# Patient Record
Sex: Female | Born: 1940
Health system: Southern US, Community
[De-identification: ages and names within clinical notes are randomized; demographics above are authoritative.]

## PROBLEM LIST (undated history)

## (undated) DIAGNOSIS — M79605 Pain in left leg: Secondary | ICD-10-CM

## (undated) DIAGNOSIS — H18519 Endothelial corneal dystrophy, unspecified eye: Secondary | ICD-10-CM

## (undated) DIAGNOSIS — E785 Hyperlipidemia, unspecified: Secondary | ICD-10-CM

## (undated) DIAGNOSIS — R42 Dizziness and giddiness: Secondary | ICD-10-CM

## (undated) DIAGNOSIS — H1851 Endothelial corneal dystrophy: Secondary | ICD-10-CM

## (undated) DIAGNOSIS — M79604 Pain in right leg: Secondary | ICD-10-CM

## (undated) DIAGNOSIS — M722 Plantar fascial fibromatosis: Secondary | ICD-10-CM

## (undated) DIAGNOSIS — E669 Obesity, unspecified: Secondary | ICD-10-CM

## (undated) DIAGNOSIS — E119 Type 2 diabetes mellitus without complications: Secondary | ICD-10-CM

## (undated) DIAGNOSIS — IMO0002 Reserved for concepts with insufficient information to code with codable children: Secondary | ICD-10-CM

## (undated) DIAGNOSIS — I10 Essential (primary) hypertension: Secondary | ICD-10-CM

## (undated) DIAGNOSIS — M1990 Unspecified osteoarthritis, unspecified site: Secondary | ICD-10-CM

## (undated) HISTORY — PX: CATARACT EXTRACTION: SUR2

## (undated) HISTORY — DX: Type 2 diabetes mellitus without complications: E11.9

## (undated) HISTORY — DX: Hyperlipidemia, unspecified: E78.5

## (undated) HISTORY — DX: Plantar fascial fibromatosis: M72.2

## (undated) HISTORY — DX: Endothelial corneal dystrophy: H18.51

## (undated) HISTORY — PX: TOTAL KNEE ARTHROPLASTY: SHX125

## (undated) HISTORY — DX: Endothelial corneal dystrophy, unspecified eye: H18.519

## (undated) HISTORY — DX: Pain in left leg: M79.605

## (undated) HISTORY — DX: Reserved for concepts with insufficient information to code with codable children: IMO0002

## (undated) HISTORY — DX: Essential (primary) hypertension: I10

## (undated) HISTORY — DX: Unspecified osteoarthritis, unspecified site: M19.90

## (undated) HISTORY — DX: Dizziness and giddiness: R42

## (undated) HISTORY — PX: TOTAL VAGINAL HYSTERECTOMY: SHX2548

## (undated) HISTORY — DX: Obesity, unspecified: E66.9

## (undated) HISTORY — DX: Pain in right leg: M79.604

---

## 1999-07-11 ENCOUNTER — Encounter: Admission: RE | Admit: 1999-07-11 | Discharge: 1999-07-11 | Payer: Self-pay | Admitting: Hematology and Oncology

## 2000-03-01 ENCOUNTER — Encounter: Admission: RE | Admit: 2000-03-01 | Discharge: 2000-03-01 | Payer: Self-pay | Admitting: Internal Medicine

## 2000-04-01 ENCOUNTER — Encounter: Admission: RE | Admit: 2000-04-01 | Discharge: 2000-04-01 | Payer: Self-pay | Admitting: Hematology and Oncology

## 2000-04-02 ENCOUNTER — Ambulatory Visit (HOSPITAL_COMMUNITY): Admission: RE | Admit: 2000-04-02 | Discharge: 2000-04-02 | Payer: Self-pay | Admitting: Internal Medicine

## 2000-04-06 ENCOUNTER — Encounter: Admission: RE | Admit: 2000-04-06 | Discharge: 2000-07-05 | Payer: Self-pay | Admitting: Internal Medicine

## 2000-04-12 ENCOUNTER — Ambulatory Visit (HOSPITAL_COMMUNITY): Admission: RE | Admit: 2000-04-12 | Discharge: 2000-04-12 | Payer: Self-pay | Admitting: Hematology and Oncology

## 2000-04-13 ENCOUNTER — Ambulatory Visit (HOSPITAL_COMMUNITY): Admission: RE | Admit: 2000-04-13 | Discharge: 2000-04-13 | Payer: Self-pay | Admitting: Internal Medicine

## 2000-04-29 ENCOUNTER — Encounter: Admission: RE | Admit: 2000-04-29 | Discharge: 2000-04-29 | Payer: Self-pay | Admitting: Internal Medicine

## 2001-05-11 ENCOUNTER — Encounter: Admission: RE | Admit: 2001-05-11 | Discharge: 2001-05-11 | Payer: Self-pay | Admitting: Internal Medicine

## 2002-04-03 ENCOUNTER — Encounter: Admission: RE | Admit: 2002-04-03 | Discharge: 2002-04-03 | Payer: Self-pay | Admitting: Internal Medicine

## 2002-04-10 ENCOUNTER — Ambulatory Visit (HOSPITAL_COMMUNITY): Admission: RE | Admit: 2002-04-10 | Discharge: 2002-04-10 | Payer: Self-pay | Admitting: Internal Medicine

## 2002-04-17 ENCOUNTER — Encounter: Admission: RE | Admit: 2002-04-17 | Discharge: 2002-04-17 | Payer: Self-pay | Admitting: Internal Medicine

## 2002-05-15 ENCOUNTER — Encounter: Admission: RE | Admit: 2002-05-15 | Discharge: 2002-05-15 | Payer: Self-pay | Admitting: Internal Medicine

## 2002-05-18 ENCOUNTER — Ambulatory Visit (HOSPITAL_COMMUNITY): Admission: RE | Admit: 2002-05-18 | Discharge: 2002-05-18 | Payer: Self-pay | Admitting: Internal Medicine

## 2002-06-19 ENCOUNTER — Encounter: Admission: RE | Admit: 2002-06-19 | Discharge: 2002-06-19 | Payer: Self-pay | Admitting: Internal Medicine

## 2002-08-11 ENCOUNTER — Ambulatory Visit (HOSPITAL_COMMUNITY): Admission: RE | Admit: 2002-08-11 | Discharge: 2002-08-11 | Payer: Self-pay | Admitting: Gastroenterology

## 2002-11-22 ENCOUNTER — Encounter: Admission: RE | Admit: 2002-11-22 | Discharge: 2002-11-22 | Payer: Self-pay | Admitting: Internal Medicine

## 2002-11-29 ENCOUNTER — Encounter: Admission: RE | Admit: 2002-11-29 | Discharge: 2002-11-29 | Payer: Self-pay | Admitting: Internal Medicine

## 2003-01-08 ENCOUNTER — Encounter: Admission: RE | Admit: 2003-01-08 | Discharge: 2003-01-08 | Payer: Self-pay | Admitting: Internal Medicine

## 2003-02-26 ENCOUNTER — Encounter: Admission: RE | Admit: 2003-02-26 | Discharge: 2003-02-26 | Payer: Self-pay | Admitting: Internal Medicine

## 2003-04-04 ENCOUNTER — Encounter: Admission: RE | Admit: 2003-04-04 | Discharge: 2003-04-04 | Payer: Self-pay | Admitting: Internal Medicine

## 2003-04-11 ENCOUNTER — Encounter: Admission: RE | Admit: 2003-04-11 | Discharge: 2003-05-09 | Payer: Self-pay | Admitting: Infectious Diseases

## 2003-06-27 ENCOUNTER — Encounter: Admission: RE | Admit: 2003-06-27 | Discharge: 2003-06-27 | Payer: Self-pay | Admitting: Internal Medicine

## 2003-08-02 ENCOUNTER — Encounter: Admission: RE | Admit: 2003-08-02 | Discharge: 2003-08-02 | Payer: Self-pay | Admitting: Internal Medicine

## 2003-08-23 ENCOUNTER — Encounter: Admission: RE | Admit: 2003-08-23 | Discharge: 2003-08-23 | Payer: Self-pay | Admitting: Internal Medicine

## 2003-08-23 ENCOUNTER — Ambulatory Visit (HOSPITAL_COMMUNITY): Admission: RE | Admit: 2003-08-23 | Discharge: 2003-08-23 | Payer: Self-pay | Admitting: Internal Medicine

## 2003-09-10 ENCOUNTER — Encounter: Admission: RE | Admit: 2003-09-10 | Discharge: 2003-09-10 | Payer: Self-pay | Admitting: Internal Medicine

## 2003-09-21 ENCOUNTER — Ambulatory Visit (HOSPITAL_COMMUNITY): Admission: RE | Admit: 2003-09-21 | Discharge: 2003-09-21 | Payer: Self-pay | Admitting: Internal Medicine

## 2003-09-21 ENCOUNTER — Encounter: Admission: RE | Admit: 2003-09-21 | Discharge: 2003-09-21 | Payer: Self-pay | Admitting: Internal Medicine

## 2003-10-03 ENCOUNTER — Encounter: Admission: RE | Admit: 2003-10-03 | Discharge: 2003-10-03 | Payer: Self-pay | Admitting: Internal Medicine

## 2003-10-23 ENCOUNTER — Encounter: Admission: RE | Admit: 2003-10-23 | Discharge: 2003-10-23 | Payer: Self-pay | Admitting: Internal Medicine

## 2003-10-26 ENCOUNTER — Ambulatory Visit: Payer: Self-pay | Admitting: Internal Medicine

## 2003-11-29 ENCOUNTER — Ambulatory Visit: Payer: Self-pay | Admitting: Internal Medicine

## 2003-12-07 ENCOUNTER — Encounter: Admission: RE | Admit: 2003-12-07 | Discharge: 2004-03-06 | Payer: Self-pay | Admitting: Infectious Diseases

## 2003-12-14 ENCOUNTER — Encounter: Admission: RE | Admit: 2003-12-14 | Discharge: 2003-12-14 | Payer: Self-pay | Admitting: Internal Medicine

## 2003-12-26 ENCOUNTER — Encounter: Admission: RE | Admit: 2003-12-26 | Discharge: 2004-03-25 | Payer: Self-pay | Admitting: Internal Medicine

## 2004-01-01 ENCOUNTER — Ambulatory Visit: Payer: Self-pay | Admitting: Internal Medicine

## 2004-06-05 ENCOUNTER — Ambulatory Visit: Payer: Self-pay | Admitting: Internal Medicine

## 2004-09-30 ENCOUNTER — Ambulatory Visit: Payer: Self-pay | Admitting: Internal Medicine

## 2004-09-30 ENCOUNTER — Ambulatory Visit (HOSPITAL_COMMUNITY): Admission: RE | Admit: 2004-09-30 | Discharge: 2004-09-30 | Payer: Self-pay | Admitting: Internal Medicine

## 2004-10-08 ENCOUNTER — Ambulatory Visit (HOSPITAL_COMMUNITY): Admission: RE | Admit: 2004-10-08 | Discharge: 2004-10-08 | Payer: Self-pay | Admitting: Internal Medicine

## 2004-11-04 ENCOUNTER — Ambulatory Visit (HOSPITAL_COMMUNITY): Admission: RE | Admit: 2004-11-04 | Discharge: 2004-11-04 | Payer: Self-pay | Admitting: Internal Medicine

## 2004-11-04 ENCOUNTER — Ambulatory Visit: Payer: Self-pay | Admitting: Internal Medicine

## 2004-11-11 ENCOUNTER — Ambulatory Visit: Payer: Self-pay | Admitting: Internal Medicine

## 2004-11-11 ENCOUNTER — Ambulatory Visit (HOSPITAL_COMMUNITY): Admission: RE | Admit: 2004-11-11 | Discharge: 2004-11-11 | Payer: Self-pay | Admitting: Internal Medicine

## 2004-11-21 ENCOUNTER — Ambulatory Visit: Payer: Self-pay | Admitting: Internal Medicine

## 2004-12-02 ENCOUNTER — Ambulatory Visit: Payer: Self-pay | Admitting: Hospitalist

## 2004-12-21 ENCOUNTER — Emergency Department (HOSPITAL_COMMUNITY): Admission: EM | Admit: 2004-12-21 | Discharge: 2004-12-21 | Payer: Self-pay | Admitting: Emergency Medicine

## 2005-03-27 ENCOUNTER — Encounter (INDEPENDENT_AMBULATORY_CARE_PROVIDER_SITE_OTHER): Payer: Self-pay | Admitting: Internal Medicine

## 2005-03-27 LAB — CONVERTED CEMR LAB: Microalbumin U total vol: 0.36 mg/L

## 2005-04-02 ENCOUNTER — Ambulatory Visit: Payer: Self-pay | Admitting: Internal Medicine

## 2005-04-21 ENCOUNTER — Ambulatory Visit: Payer: Self-pay | Admitting: Internal Medicine

## 2005-05-12 ENCOUNTER — Ambulatory Visit: Payer: Self-pay | Admitting: Hospitalist

## 2005-11-18 ENCOUNTER — Ambulatory Visit: Payer: Self-pay | Admitting: Hospitalist

## 2005-11-19 ENCOUNTER — Encounter (INDEPENDENT_AMBULATORY_CARE_PROVIDER_SITE_OTHER): Payer: Self-pay | Admitting: Internal Medicine

## 2005-11-19 LAB — CONVERTED CEMR LAB: TSH: 2.965 microintl units/mL

## 2005-12-03 ENCOUNTER — Ambulatory Visit (HOSPITAL_COMMUNITY): Admission: RE | Admit: 2005-12-03 | Discharge: 2005-12-03 | Payer: Self-pay | Admitting: Family Medicine

## 2005-12-10 ENCOUNTER — Encounter (INDEPENDENT_AMBULATORY_CARE_PROVIDER_SITE_OTHER): Payer: Self-pay | Admitting: Internal Medicine

## 2005-12-10 DIAGNOSIS — J329 Chronic sinusitis, unspecified: Secondary | ICD-10-CM | POA: Insufficient documentation

## 2005-12-10 DIAGNOSIS — E669 Obesity, unspecified: Secondary | ICD-10-CM | POA: Insufficient documentation

## 2005-12-10 DIAGNOSIS — G4700 Insomnia, unspecified: Secondary | ICD-10-CM | POA: Insufficient documentation

## 2005-12-10 DIAGNOSIS — Z8719 Personal history of other diseases of the digestive system: Secondary | ICD-10-CM | POA: Insufficient documentation

## 2005-12-10 DIAGNOSIS — I10 Essential (primary) hypertension: Secondary | ICD-10-CM | POA: Insufficient documentation

## 2005-12-10 DIAGNOSIS — H268 Other specified cataract: Secondary | ICD-10-CM | POA: Insufficient documentation

## 2005-12-10 DIAGNOSIS — M1990 Unspecified osteoarthritis, unspecified site: Secondary | ICD-10-CM | POA: Insufficient documentation

## 2005-12-10 DIAGNOSIS — H9319 Tinnitus, unspecified ear: Secondary | ICD-10-CM | POA: Insufficient documentation

## 2005-12-10 DIAGNOSIS — L659 Nonscarring hair loss, unspecified: Secondary | ICD-10-CM | POA: Insufficient documentation

## 2005-12-10 DIAGNOSIS — G43909 Migraine, unspecified, not intractable, without status migrainosus: Secondary | ICD-10-CM | POA: Insufficient documentation

## 2005-12-24 ENCOUNTER — Ambulatory Visit: Payer: Self-pay | Admitting: Internal Medicine

## 2006-02-10 ENCOUNTER — Ambulatory Visit: Payer: Self-pay | Admitting: *Deleted

## 2007-10-18 ENCOUNTER — Encounter: Admission: RE | Admit: 2007-10-18 | Discharge: 2007-10-18 | Payer: Self-pay | Admitting: Family Medicine

## 2007-10-19 ENCOUNTER — Ambulatory Visit (HOSPITAL_COMMUNITY): Admission: RE | Admit: 2007-10-19 | Discharge: 2007-10-19 | Payer: Self-pay | Admitting: Family Medicine

## 2007-11-02 ENCOUNTER — Inpatient Hospital Stay (HOSPITAL_COMMUNITY): Admission: RE | Admit: 2007-11-02 | Discharge: 2007-11-07 | Payer: Self-pay | Admitting: Orthopedic Surgery

## 2008-01-16 ENCOUNTER — Ambulatory Visit (HOSPITAL_COMMUNITY): Admission: RE | Admit: 2008-01-16 | Discharge: 2008-01-16 | Payer: Self-pay | Admitting: Orthopedic Surgery

## 2008-04-15 HISTORY — PX: TOTAL KNEE ARTHROPLASTY: SHX125

## 2008-10-22 ENCOUNTER — Ambulatory Visit (HOSPITAL_COMMUNITY): Admission: RE | Admit: 2008-10-22 | Discharge: 2008-10-22 | Payer: Self-pay | Admitting: Family Medicine

## 2009-04-05 ENCOUNTER — Encounter: Admission: RE | Admit: 2009-04-05 | Discharge: 2009-04-05 | Payer: Self-pay | Admitting: Family Medicine

## 2009-04-15 ENCOUNTER — Inpatient Hospital Stay (HOSPITAL_COMMUNITY): Admission: RE | Admit: 2009-04-15 | Discharge: 2009-04-18 | Payer: Self-pay | Admitting: Orthopedic Surgery

## 2010-01-03 ENCOUNTER — Ambulatory Visit (HOSPITAL_COMMUNITY): Admission: RE | Admit: 2010-01-03 | Discharge: 2010-01-03 | Payer: Self-pay | Admitting: Family Medicine

## 2010-05-14 LAB — GLUCOSE, CAPILLARY
Glucose-Capillary: 107 mg/dL — ABNORMAL HIGH (ref 70–99)
Glucose-Capillary: 111 mg/dL — ABNORMAL HIGH (ref 70–99)
Glucose-Capillary: 115 mg/dL — ABNORMAL HIGH (ref 70–99)
Glucose-Capillary: 123 mg/dL — ABNORMAL HIGH (ref 70–99)
Glucose-Capillary: 126 mg/dL — ABNORMAL HIGH (ref 70–99)
Glucose-Capillary: 128 mg/dL — ABNORMAL HIGH (ref 70–99)
Glucose-Capillary: 155 mg/dL — ABNORMAL HIGH (ref 70–99)
Glucose-Capillary: 168 mg/dL — ABNORMAL HIGH (ref 70–99)
Glucose-Capillary: 93 mg/dL (ref 70–99)
Glucose-Capillary: 99 mg/dL (ref 70–99)

## 2010-05-14 LAB — PROTIME-INR
INR: 1.73 — ABNORMAL HIGH (ref 0.00–1.49)
Prothrombin Time: 15.4 seconds — ABNORMAL HIGH (ref 11.6–15.2)

## 2010-05-14 LAB — URINALYSIS, ROUTINE W REFLEX MICROSCOPIC
Glucose, UA: NEGATIVE mg/dL
Ketones, ur: NEGATIVE mg/dL
Nitrite: NEGATIVE
Protein, ur: NEGATIVE mg/dL

## 2010-05-14 LAB — BASIC METABOLIC PANEL
CO2: 25 mEq/L (ref 19–32)
Chloride: 106 mEq/L (ref 96–112)
Creatinine, Ser: 0.99 mg/dL (ref 0.4–1.2)
GFR calc non Af Amer: 49 mL/min — ABNORMAL LOW (ref >60)
GFR calc non Af Amer: 55 mL/min — ABNORMAL LOW (ref >60)
Potassium: 4 mEq/L (ref 3.5–5.1)
Potassium: 4.3 mEq/L (ref 3.5–5.1)
Sodium: 135 mEq/L (ref 135–145)

## 2010-05-14 LAB — CBC
HCT: 29.6 % — ABNORMAL LOW (ref 36.0–46.0)
Hemoglobin: 10.2 g/dL — ABNORMAL LOW (ref 12.0–15.0)
Hemoglobin: 10.6 g/dL — ABNORMAL LOW (ref 12.0–15.0)
MCHC: 34.1 g/dL (ref 30.0–36.0)
MCHC: 35 g/dL (ref 30.0–36.0)
Platelets: 122 10*3/uL — ABNORMAL LOW (ref 150–400)
Platelets: 146 10*3/uL — ABNORMAL LOW (ref 150–400)
RBC: 3.23 MIL/uL — ABNORMAL LOW (ref 3.87–5.11)
RDW: 13.8 % (ref 11.5–15.5)
RDW: 13.9 % (ref 11.5–15.5)
WBC: 10.3 10*3/uL (ref 4.0–10.5)
WBC: 10.4 10*3/uL (ref 4.0–10.5)

## 2010-06-05 ENCOUNTER — Other Ambulatory Visit: Payer: Self-pay | Admitting: Family Medicine

## 2010-06-05 ENCOUNTER — Ambulatory Visit
Admission: RE | Admit: 2010-06-05 | Discharge: 2010-06-05 | Disposition: A | Discharge status: Home / Self Care | Payer: Medicare Other | Source: Ambulatory Visit | Attending: Family Medicine | Admitting: Family Medicine

## 2010-06-05 DIAGNOSIS — M545 Low back pain, unspecified: Secondary | ICD-10-CM

## 2010-07-08 NOTE — Op Note (Signed)
[name], [name]               ACCOUNT NO.:  [id number]   MEDICAL RECORD NO.:  [id number]          PATIENT TYPE:  AMB   LOCATION:  DAY                          FACILITY:  [facility]   PHYSICIAN:  Ollen Gross, M.D.    DATE OF BIRTH:  1940-10-04   DATE OF PROCEDURE:  01/16/2008  DATE OF DISCHARGE:                               OPERATIVE REPORT   PREOPERATIVE DIAGNOSIS:  Right knee arthrofibrosis.   POSTOPERATIVE DIAGNOSIS:  Right knee arthrofibrosis.   PROCEDURE:  Right knee closed manipulation.   SURGEON:  Ollen Gross, M.D.   ASSISTANT:  No assistant.   ANESTHESIA:  General.   Premanipulation, range of motion 5 to 95 post manipulation range of  motion 1 to 125.   COMPLICATIONS:  None.   CONDITION:  Stable to recovery.   CLINICAL NOTE:  [name] is a 70 year old female who underwent right  total knee arthroplasty approximately 2 months ago.  She has not  progressed in physical therapy for the past 4 weeks.  She is stuck at 95  degrees of flexion.  She has pain due to stiffness.  She presents now  for closed manipulation.   PROCEDURE IN DETAIL:  And the successful administration of general  anesthetic, exam under anesthesia performed.  She has range of motion 5-  95 degrees.  I subsequently placed my chest on the proximal tibia and  flexed the knee with audible lysis of adhesions.  I was easily able to  get her to 125 degrees.  I was also to achieve full extension and  improvement of patellar mobility.  She is subsequently awakened and  transferred to recovery in stable condition.      Ollen Gross, M.D.  Electronically Signed     FA/MEDQ  D:  01/16/2008  T:  01/17/2008  Job:  [id number]

## 2010-07-08 NOTE — H&P (Signed)
[name], [name]               ACCOUNT NO.:  [id number]   MEDICAL RECORD NO.:  [id number]          PATIENT TYPE:  INP   LOCATION:  1606                         FACILITY:  [facility]   PHYSICIAN:  [name], M.D.    DATE OF BIRTH:  December 10, 1940   DATE OF ADMISSION:  11/02/2007  DATE OF DISCHARGE:                              HISTORY & PHYSICAL   DATE OF OFFICE VISIT/HISTORY AND PHYSICAL:  September 27, 2007.   CHIEF COMPLAINT:  Right knee pain.   HISTORY OF PRESENT ILLNESS:  The patient is a 70 year old female who has  been seen by Dr. Lequita Halt for ongoing bilateral knee pain.  The pain in  her knees has been problematic for quite some time now.  She has been  seen by Dr. August Saucer in the past.  She has undergone conservative treatment,  including viscosupplementation.  She follows up with Dr. Lequita Halt and was  found to have significant bone-on-bone in the medial compartment of both  knees,  especially advanced in the right knee.  The right is actually  more symptomatic than the left.  She is at a point where she would like  to have something done about it.  Risks and benefits discussed.  The  patient subsequently was admitted to the hospital.   ALLERGIES:  No known drug allergies.   CURRENT MEDICATIONS:  Lipitor, amlodipine, Flexeril, Tylenol, Tekturna,  HCTZ, baby aspirin, omega-3 fish oil and fluticasone.   PAST MEDICAL HISTORY:  Migraines, tinnitus, vertigo, anxiety, cataracts,  bronchitis, hypertension, cardiac murmur, hypercholesterolemia, varicose  veins, reflux disease, hemorrhoids, diverticulosis, history of urinary  tract infections, diet-controlled diabetes, fibromyalgia, arthritis,  postmenopausal.   PAST SURGICAL HISTORY:  Hysterectomy and eye surgery.   SOCIAL HISTORY:  Divorced, retired.  Nonsmoker.  Rare intake of alcohol.  Three children.  Lives alone.  She would like to look into a skilled  rehab facility.   FAMILY HISTORY:  Mother with history of stroke and diabetes.   She has a  sibling with diabetes.   REVIEW OF SYSTEMS:  GENERAL:  Occasional chills.  No fevers, night  sweats.  NEUROLOGIC:  Occasional headaches and dizziness and insomnia  and also tinnitus.  RESPIRATORY:  Denies any shortness of breath,  productive cough or hemoptysis.  CARDIOVASCULAR:  Occasional  palpitations.  No chest pain or angina.  She does have a cardiac murmur.  GASTROINTESTINAL:  Occasional nausea and constipation.  No diarrhea, no  vomiting.  No bloody mucus in the stool.  GENITOURINARY:  A little bit  of frequency, nocturia.  No dysuria, hematuria.  MUSCULOSKELETAL:  Joint  pain.   PHYSICAL EXAMINATION:  VITAL SIGNS:  Pulse 64, respirations 14, blood  pressure 128/84.  GENERAL:  A 70 year old [place] female.  Well-nourished, well-  developed, overweight.  Accompanied by her daughter.  HEENT:  Normocephalic, atraumatic.  Pupils are round and reactive.  Does  wear glasses.  EOMs intact.  NECK:  Supple.  CHEST:  Clear.  HEART:  Regular rate and rhythm with a grade 3/6 systolic ejection  murmur best heard over the aortic point.  ABDOMEN:  Soft, round.  Bowel sounds present.  RECTAL/BREAST/GENITALIA:  Not done, not pertinent to present illness.  EXTREMITIES:  Right knee range of motion 10 to 125, marked crepitus,  tender over medial and lateral.  No instability.  Left knee shows range  of motion 5 to 120, marked crepitus, tender more medial than lateral.   IMPRESSION:  Osteoarthritis, right knee.   PLAN:  The patient admitted to [facility] to undergo a right  total knee replacement arthroplasty.  Surgery will be performed by Dr.  Ollen Gross.  She does want to look into a skilled nursing facility  such as [facility] or other rehab facilities.      Alexzandrew L. Perkins, P.A.C.      [name], M.D.  Electronically Signed    ALP/MEDQ  D:  11/01/2007  T:  11/02/2007  Job:  [id number]   cc:   [name], M.D.  Fax: [phone]    Emeterio Reeve, MD  Fax: [phone]

## 2010-07-08 NOTE — Discharge Summary (Signed)
[name], [name]               ACCOUNT NO.:  [id number]   MEDICAL RECORD NO.:  [id number]          PATIENT TYPE:  INP   LOCATION:  1606                         FACILITY:  [facility]   PHYSICIAN:  Ollen Gross, M.D.    DATE OF BIRTH:  June 02, 1940   DATE OF ADMISSION:  11/02/2007  DATE OF DISCHARGE:  11/07/2007                               DISCHARGE SUMMARY   TENTATIVE DATE OF DISCHARGE:  November 07, 2007.   ADMITTING DIAGNOSES:  1. Osteoarthritis right knee.  2. Migraines.  3. Tinnitus.  4. Vertigo.  5. Anxiety.  6. Cataracts.  7. History of bronchitis.  8. Hypertension.  9. Cardiac murmur.  10.Hypercholesterolemia.  11.Varicose veins.  12.Reflux disease.  13.Hemorrhoids.  14.History of diverticulosis.  15.History of urinary tract infection.  16.Diet controlled diabetes.  17.Fibromyalgia.  18.Arthritis.  19.Postmenopausal.   DISCHARGE DIAGNOSIS:  1. Osteoarthritis right knee status post right total knee      arthroplasty.  2. Migraines.  3. Tinnitus.  4. Vertigo.  5. Anxiety.  6. Cataracts.  7. History of bronchitis.  8. Hypertension.  9. Cardiac murmur.  10.Hypercholesterolemia.  11.Varicose veins.  12.Reflux disease.  13.Hemorrhoids.  14.History of diverticulosis.  15.History of urinary tract infection.  16.Diet controlled diabetes.  17.Fibromyalgia.  18.Arthritis.  19.Postmenopausal.   PROCEDURE:  November 02, 2007 right total knee.  Surgeon Dr. Lequita Halt.  Assistant Elizabeth Peace, PA-C.   ANESTHESIA:  General.   CONSULTS:  None.   BRIEF HISTORY:  [name] is a 70 year old female with end-stage  arthritis of right knee, progressive worsening pain dysfunction, failed  nonoperative management now presents for total knee arthroplasty.   LABORATORY DATA:  CBC on admission, hemoglobin 12.8, hematocrit 37.9,  red cell count 4.63, white cell count 9.6, platelets 225,000.  Chem  panel on admission all within normal limits with the exception of  minimally elevated creatinine 1.23.  PT/INR preop 13.6, 1.0 with PTT of  34.  Preop UA trace leukocyte esterase, only 0-2 white cells, rare  bacteria.  Serial CBCs were followed.  Hemoglobin dropped to 11.2 then  10.3.  Last H&H is 10.2 and 31.1.  Serial pro time  followed.  PT/INR  23.1 and 1.9.  Serial BMET followed.  Glucose went up to 160 back down  to 127.  Remaining electrolytes remained within normal limits.   CHEST X-RAY:  October 18, 2007, borderline heart size, no acute chest  findings.   EKG:  Dated October 14, 2007, normal sinus rhythm, no acute ischemic  changes.   HOSPITAL COURSE:  The patient was admitted to [facility].  Tolerated the procedure well, later transferred to the recovery room,  orthopedic floor started on PCA and p.o. analgesic for pain control  following surgery.  She did pretty well the morning of day one.  Hemoglobin was 11.2.  Output was excellent.  Started back on her home  meds for hypertension.  Started getting up and walking short distances  on day one.  By day two dressing was changed, incision looked good.  No  complaints.  She was afebrile.  Hemoglobin was 10.3.  She was slowly  progressing with physical therapy and the patient wanted to look into  skilled facility for rehab.  Discharge planning social worker got  involved with seeking of bed.  She stayed through the weekend, which was  day 3 and day 4, walking short distances.  Incision was healing well, no  complaints.  By day 5, November 07, 2007, it was thought that a bed  would possibly be available at [facility] today.  The patient was in  agreement and wanted to go.  She was progressing well, no issues, and  was discharged at that time.   DISCHARGE/PLAN:  1. Tentative date of discharge/transfer. November 07, 2007.  2. Discharge diagnoses, please see above.  3. Discharge meds Coumadin protocol.  Please titrate Coumadin level      for target INR between 2.0 and 3.0.  She needs  to be on Coumadin      for 3 weeks from date of surgery of November 02, 2007.  Colace 100      mg p.o. b.i.d., Tekturna 300 mg p.o. daily, hydrochlorothiazide      12.5 mg daily, Norvasc 10 mg daily, Lipitor 10 mg daily, laxative      of choice, enema of choice, Percocet 5 mg 1 or 2 every 4 hours as      needed for pain, Tylenol 325 one or two every 4 to 6 hours for mild      pain temporal headache.  Robaxin 500 mg p.o. q.6 to 8 hours p.r.n.      spasm.  Flonase 2 sprays per nostril daily p.r.n.  Dulcolax      suppositories per rectum p.r.n.   DIET:  Heart-healthy diet.   ACTIVITIES:  Weightbearing as tolerated to the right lower extremity.  Gait training, ambulation, ADLs, PT and OT for total knee protocol.  Daily dressing changes.  She may start showering.  However, do not  submerge incision under water.   FOLLOW UP:  She is to follow up with Dr. Lequita Halt in the office 2 weeks  from the date of surgery, please contact the office at [phone] to help  the patient arrange for an appointment followup and transportation.   DISPOSITION:  Tentative bed at [facility].   CONDITION ON DISCHARGE:  Improving.      [name], P.A.C.      Ollen Gross, M.D.  Electronically Signed    ALP/MEDQ  D:  11/07/2007  T:  11/07/2007  Job:  [id number]   cc:   Ollen Gross, M.D.  Fax: [phone]   Emeterio Reeve, MD  Fax: [phone]

## 2010-07-08 NOTE — Op Note (Signed)
NAMEMARIEME, Gross               ACCOUNT NO.:  [id number]   MEDICAL RECORD NO.:  [id number]          PATIENT TYPE:  INP   LOCATION:  1606                         FACILITY:  [facility]   PHYSICIAN:  [name], M.D.    DATE OF BIRTH:  05-05-1940   DATE OF PROCEDURE:  11/02/2007  DATE OF DISCHARGE:                               OPERATIVE REPORT   PREOPERATIVE DIAGNOSIS:  Osteoarthritis right knee.   POSTOPERATIVE DIAGNOSIS:  Osteoarthritis right knee.   PROCEDURE:  Right total knee arthroplasty.   SURGEON:  [name], M.D.   ASSISTANT:  [name], P.A.C.   ANESTHESIA:  General with postop Marcaine pain pump.   ESTIMATED BLOOD LOSS:  Minimal.   DRAINS:  Autovac x1.   COMPLICATIONS:  None.   CONDITION:  Stable to recovery.   BRIEF CLINICAL NOTE:  [name] is a 70 year old female who has end-  stage arthritis of the right knee with progressively worsening pain and  dysfunction.  She has failed nonoperative management and presents now  for total knee arthroplasty.   PROCEDURE IN DETAIL:  After the successful administration of general  anesthetic a tourniquet was placed high on the right thigh and right  lower extremity prepped and draped in the usual sterile fashion.  Extremity was wrapped in Esmarch, knee flexed, tourniquet inflated to  350 mmHg.  A midline incision was made with a 10 blade through  subcutaneous tissue to the level of the extensor mechanism.  A fresh  blade was used to make a medial parapatellar arthrotomy.  Soft tissue of  the proximal medial tibia subperiosteally elevated at the joint line  with the knife and semimembranosus bursa with a Cobb elevator.  Soft  tissue laterally was elevated with attention being paid to avoid the  patellar tendon and tibial tubercle.  The patella was subluxed laterally  and the knee flexed 90 degrees.  ACL and PCL removed.  Drill was used to  create a starting hole in the distal femur and the canal was  thoroughly  irrigated.  The 5 degree right valgus alignment guide was placed and  referencing off posterior condyles rotation was marked and the block  pinned to remove 11 mm off the distal femur.  I took 11 because of preop  flexion contracture.  Distal femoral resection was made with an  oscillating saw.  Sizing block was placed, size 3 is most appropriate.  Rotation was marked at the epicondylar axis.  Size 3 cutting block is  placed and the anterior posterior chamfer cuts were made.   Tibia subluxed forward and menisci removed.  Extramedullary tibial  alignment guide was placed referencing proximally at the medial aspect  of the tibial tubercle and distally along the second metatarsal axis and  tibial crest.  The block is pinned to remove about 4 mm off the more  deficient medial side.  Tibial resection is made with an oscillating  saw.  Size 2.5 is the most appropriate tibial component and the proximal  tibia was prepared with the modular drill and keel punch for a size 2.5.  Femoral  preparation is completed with the intercondylar cut for the size  3.   Size 2.5 mobile bearing tibial trial, size 3 posterior stabilized  femoral trial and a 10 mm posterior stabilized rotating platform insert  trial are placed.  With the 10 there is a little bit of varus-valgus.  Placement with the 12.5 allowed for full extension and showed excellent  varus-valgus and anterior-posterior balance throughout full range of  motion.  The patella was everted and this was measured to be 21 mm.  Freehand resection was taken to 12 mm, 35 template is placed, lug holes  were drilled, trial patella was placed and it tracks normally.  Osteophytes removed off the posterior femur with the trial placed.  All  trials were removed and the cut bone surfaces prepared with pulsatile  lavage.  Cement was mixed and once ready for implantation a size 2.5  mobile bearing tibial tray, size 3 posterior stabilized femur and a  35  patella are cemented into place and the patella was held with a clamp.  Trial 12.5 mm insert was placed, knee held in full extension, all  extruded cement removed.  Once the cement is fully hardened then the  permanent 12.5 mm posterior stabilized rotating platform insert is  placed in the tibial tray.  Wound was copiously irrigated with saline  solution and the extensor mechanism closed over an Autovac drain with  interrupted #1 PDS.  Flexion against gravity to 135 degrees.  The  tourniquet was released with a total time of 49 minutes.  The drain was  hooked to suction.  Subcu was closed with interrupted 2-0 Vicryl and  subcuticular running 4-0 Monocryl.  Catheter for the Marcaine pain pump  was placed and the pump was initiated.  Steri-Strips and bulky sterile  dressing were applied and she was placed into a knee immobilizer,  awakened and transported to recovery in stable condition.      [name], M.D.  Electronically Signed     FA/MEDQ  D:  11/02/2007  T:  11/02/2007  Job:  [id number]

## 2010-07-11 NOTE — Op Note (Signed)
   NAME:  [name], [name]                         ACCOUNT NO.:  [id number]   MEDICAL RECORD NO.:  [id number]                   PATIENT TYPE:  AMB   LOCATION:  ENDO                                 [facility]AC[facility]  [facility]   PHYSICIAN:  Graylin Shiver, M.D.                DATE OF BIRTH:  July 28, 1940   DATE OF PROCEDURE:  DATE OF DISCHARGE:                                 OPERATIVE REPORT   PROCEDURE:  Esophagogastroduodenoscopy.   INDICATIONS FOR PROCEDURE:  Intermittent dysphagia, intermittent heartburn.   Informed consent was obtained.   PREMEDICATION:  Fentanyl 50 mcg IV, Versed 4 mg IV.   DESCRIPTION OF PROCEDURE:  With the patient in the left lateral decubitus  position, the Olympus gastroscope was inserted into the oropharynx and  passed into the esophagus, it was advanced down the esophagus and into the  stomach and into the duodenum. The second portion and bulb of the duodenum  were normal. The stomach looked normal in its entirety. The scope was  retroflexed, the fundus and cardia looked normal. The scope was straightened  and brought back into the esophagus which looked normal in its entirety. The  esophagogastric junction was 30 cm. There was no evidence of esophageal  stricture or stenosis. She tolerated the procedure well without  complications.   IMPRESSION:  Normal esophagogastroduodenoscopy.                                               Graylin Shiver, M.D.    SFG/MEDQ  D:  08/11/2002  T:  08/12/2002  Job:  [id number]   cc:   Talmage Coin, M.D.  [address]  [place]  [place] [address]  Fax: [phone]

## 2010-07-11 NOTE — Op Note (Signed)
   NAME:  [name], [name] NO.:  [id number]   MEDICAL RECORD NO.:  [id number]                   PATIENT TYPE:   LOCATION:                                       FACILITY:   PHYSICIAN:  Graylin Shiver, M.D.                DATE OF BIRTH:   DATE OF PROCEDURE:  08/11/2002  DATE OF DISCHARGE:                                 OPERATIVE REPORT   PROCEDURE:  Colonoscopy.   INDICATIONS FOR PROCEDURE:  Rectal bleeding.   PREMEDICATION:  The procedure was done directly after an EGD.  Total  medications received fentanyl  75 mcg IV, Versed 8 mg IV.   DESCRIPTION OF PROCEDURE:  After informed consent was obtained, sedation was  achieved, rectal exam was performed. No masses were felt.  The Olympus  colonoscope was inserted into the rectum and advanced around the colon to  the cecum.  Cecal landmarks were identified.  The cecum and ascending colon  were normal.  The transverse colon was normal.  The descending colon and  sigmoid revealed occasional diverticula __________.  She tolerated the  procedure well without complications.   IMPRESSION:  Mild diverticulosis of the sigmoid colon.  Otherwise normal  colonoscopy.   COMMENTS:  I suspect that the patient's intermittent rectal bleeding is  secondary to some anal irritation.  There is no evidence of bleeding or  abnormality.                                               Graylin Shiver, M.D.    SFG/MEDQ  D:  08/11/2002  T:  08/12/2002  Job:  [id number]   cc:   Talmage Coin, M.D.  [address]  [place]  [place] [address]  Fax: [phone]

## 2010-11-25 LAB — BASIC METABOLIC PANEL
CO2: 26
Calcium: 9.4
Chloride: 105
Glucose, Bld: 108 — ABNORMAL HIGH
Sodium: 140

## 2010-11-26 LAB — BASIC METABOLIC PANEL
CO2: 25
Chloride: 107
GFR calc Af Amer: 55 — ABNORMAL LOW
GFR calc Af Amer: >60
GFR calc non Af Amer: 59 — ABNORMAL LOW
Glucose, Bld: 160 — ABNORMAL HIGH
Potassium: 4.1
Potassium: 4.4
Sodium: 138
Sodium: 138

## 2010-11-26 LAB — GLUCOSE, CAPILLARY: Glucose-Capillary: 117 — ABNORMAL HIGH

## 2010-11-26 LAB — CBC
HCT: 30.8 — ABNORMAL LOW
HCT: 31.1 — ABNORMAL LOW
HCT: 33.6 — ABNORMAL LOW
Hemoglobin: 10.2 — ABNORMAL LOW
Hemoglobin: 10.3 — ABNORMAL LOW
Hemoglobin: 11.2 — ABNORMAL LOW
MCHC: 32.7
RBC: 3.59 — ABNORMAL LOW
RBC: 3.9
RDW: 13.9
WBC: 13.2 — ABNORMAL HIGH
WBC: 13.5 — ABNORMAL HIGH

## 2010-11-26 LAB — PROTIME-INR
INR: 1.1
INR: 1.5
Prothrombin Time: 19.1 — ABNORMAL HIGH

## 2011-01-08 ENCOUNTER — Other Ambulatory Visit (HOSPITAL_COMMUNITY): Payer: Self-pay | Admitting: Family Medicine

## 2011-01-08 DIAGNOSIS — Z1231 Encounter for screening mammogram for malignant neoplasm of breast: Secondary | ICD-10-CM

## 2011-01-08 DIAGNOSIS — Z780 Asymptomatic menopausal state: Secondary | ICD-10-CM

## 2011-02-11 ENCOUNTER — Ambulatory Visit (HOSPITAL_COMMUNITY)
Admission: RE | Admit: 2011-02-11 | Discharge: 2011-02-11 | Disposition: A | Discharge status: Home / Self Care | Payer: Medicare Other | Source: Ambulatory Visit | Attending: Family Medicine | Admitting: Family Medicine

## 2011-02-11 DIAGNOSIS — Z13820 Encounter for screening for osteoporosis: Secondary | ICD-10-CM | POA: Insufficient documentation

## 2011-02-11 DIAGNOSIS — Z1231 Encounter for screening mammogram for malignant neoplasm of breast: Secondary | ICD-10-CM | POA: Insufficient documentation

## 2011-02-11 DIAGNOSIS — Z780 Asymptomatic menopausal state: Secondary | ICD-10-CM

## 2011-03-04 DIAGNOSIS — H269 Unspecified cataract: Secondary | ICD-10-CM | POA: Diagnosis not present

## 2011-04-24 DIAGNOSIS — H269 Unspecified cataract: Secondary | ICD-10-CM | POA: Diagnosis not present

## 2011-04-30 DIAGNOSIS — I10 Essential (primary) hypertension: Secondary | ICD-10-CM | POA: Diagnosis not present

## 2011-04-30 DIAGNOSIS — E119 Type 2 diabetes mellitus without complications: Secondary | ICD-10-CM | POA: Diagnosis not present

## 2011-04-30 DIAGNOSIS — H269 Unspecified cataract: Secondary | ICD-10-CM | POA: Diagnosis not present

## 2011-04-30 DIAGNOSIS — H2589 Other age-related cataract: Secondary | ICD-10-CM | POA: Diagnosis not present

## 2011-04-30 DIAGNOSIS — Z8719 Personal history of other diseases of the digestive system: Secondary | ICD-10-CM | POA: Insufficient documentation

## 2011-05-07 DIAGNOSIS — M5137 Other intervertebral disc degeneration, lumbosacral region: Secondary | ICD-10-CM | POA: Diagnosis not present

## 2011-05-07 DIAGNOSIS — IMO0002 Reserved for concepts with insufficient information to code with codable children: Secondary | ICD-10-CM | POA: Diagnosis not present

## 2011-05-07 DIAGNOSIS — J309 Allergic rhinitis, unspecified: Secondary | ICD-10-CM | POA: Diagnosis not present

## 2011-05-07 DIAGNOSIS — J329 Chronic sinusitis, unspecified: Secondary | ICD-10-CM | POA: Diagnosis not present

## 2011-05-21 IMAGING — CR DG CHEST 2V
2 series · 2 of 2 positions shown · non-contrast
Comparison: 10/18/2007

CLINICAL DATA: Preop evaluation for cardiovascular exam, cardiac
murmur

CHEST - 2 VIEW

[view not recorded (1 of 2)]
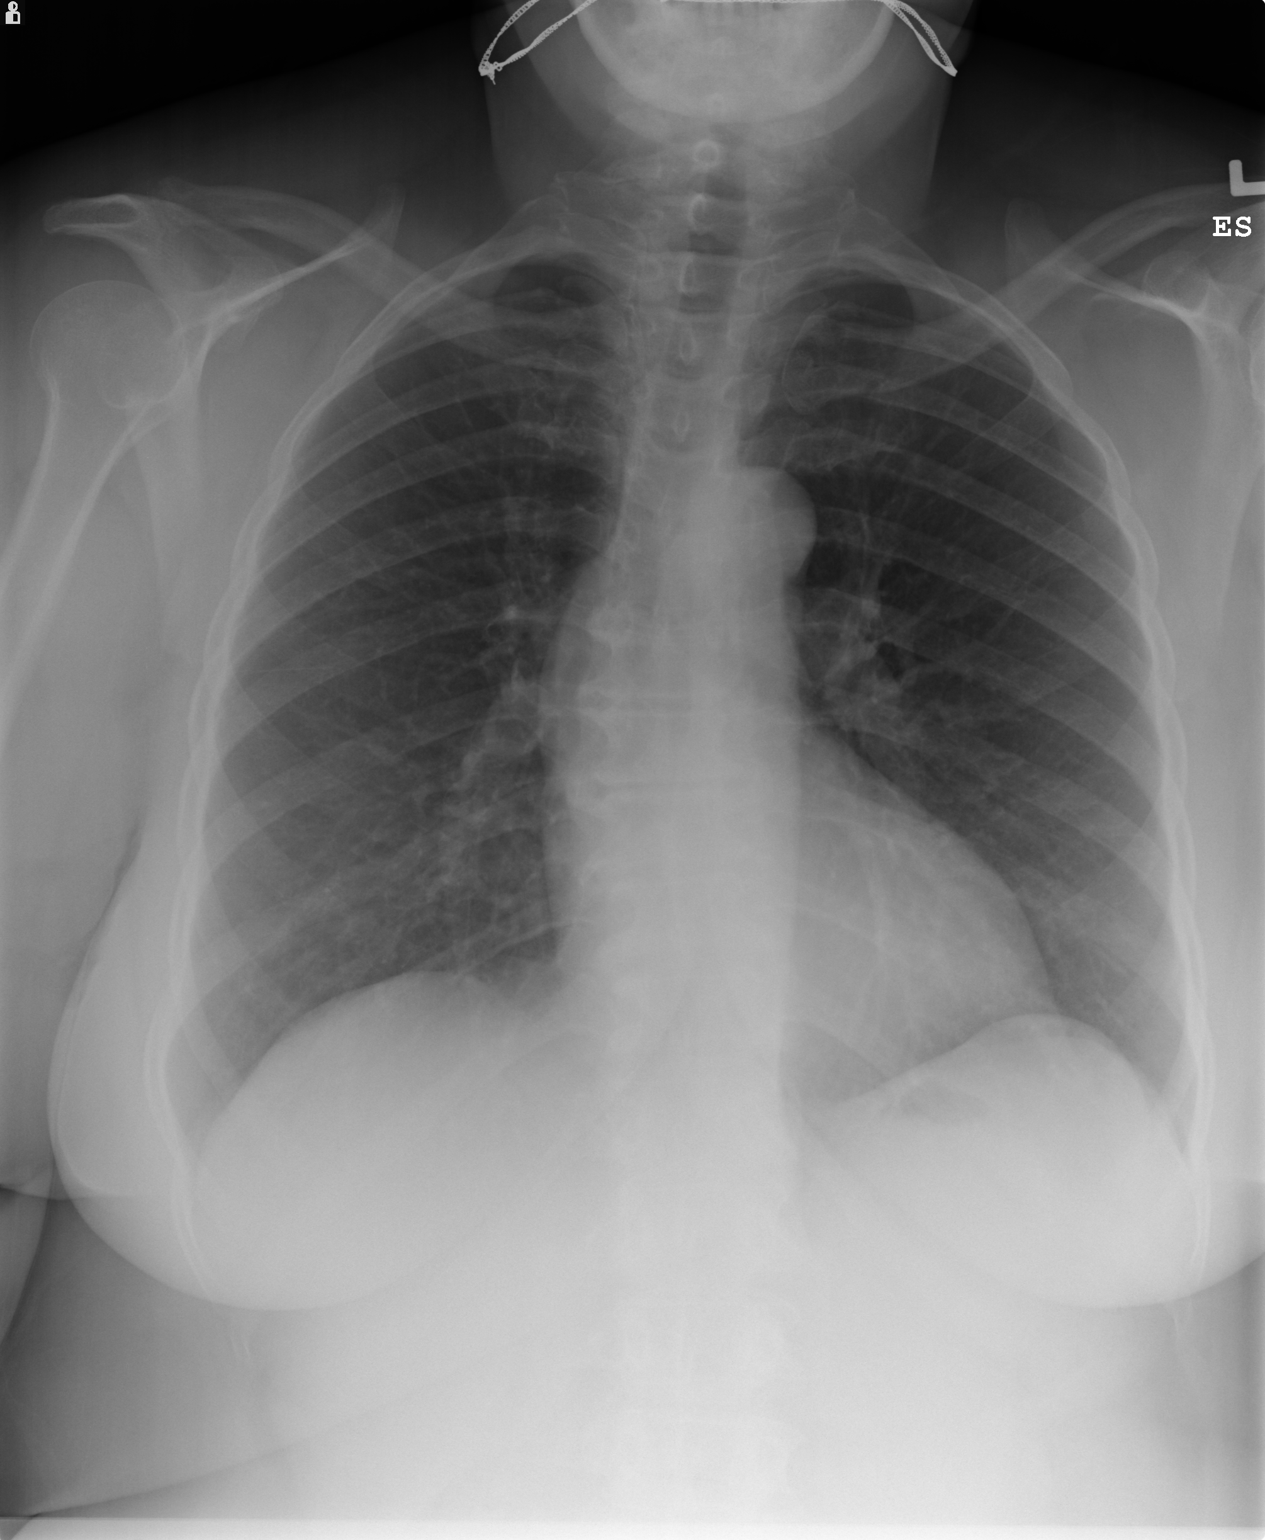

[view not recorded (2 of 2)]
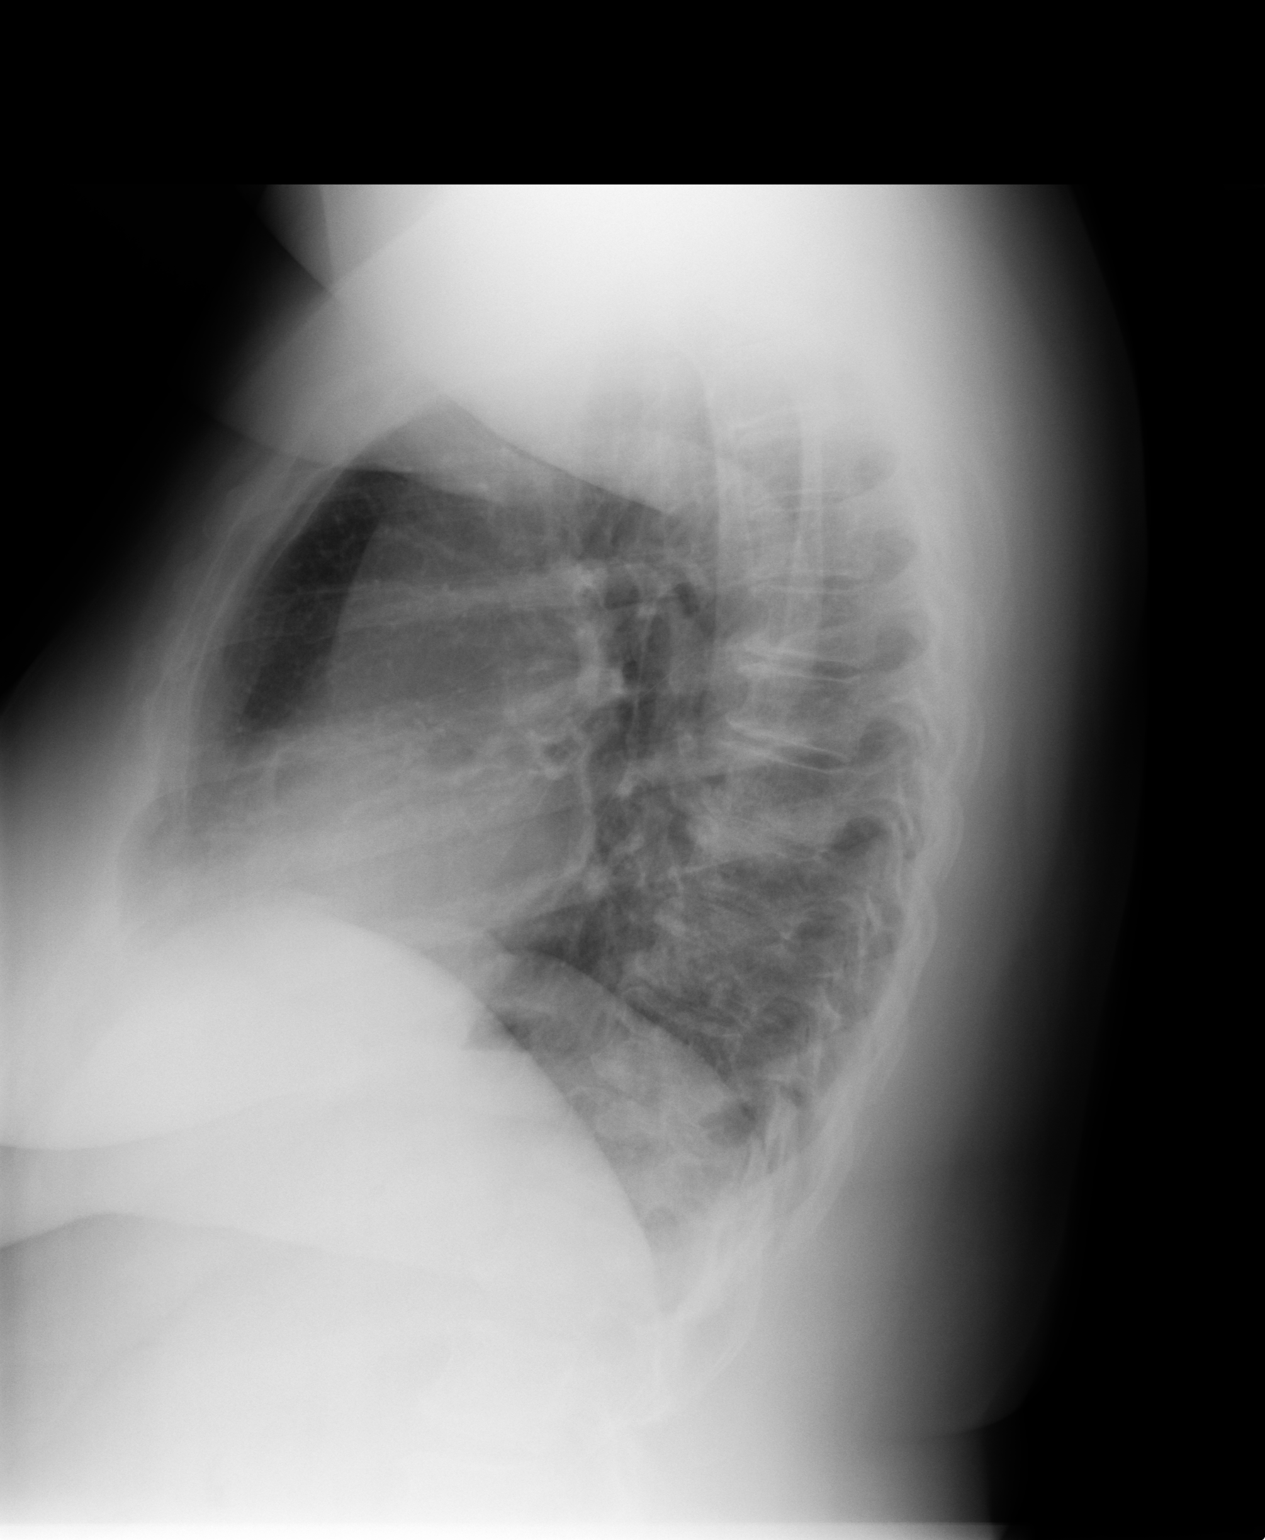

[2 of 2 positions shown; findings below may reference images not displayed]

FINDINGS: The heart size and mediastinal contours are within
normal limits.  Both lungs are clear.  The visualized skeletal
structures are unremarkable.
IMPRESSION: No active cardiopulmonary disease.

## 2011-06-05 DIAGNOSIS — H18519 Endothelial corneal dystrophy, unspecified eye: Secondary | ICD-10-CM | POA: Insufficient documentation

## 2011-06-24 DIAGNOSIS — S46819A Strain of other muscles, fascia and tendons at shoulder and upper arm level, unspecified arm, initial encounter: Secondary | ICD-10-CM | POA: Diagnosis not present

## 2011-06-24 DIAGNOSIS — S43499A Other sprain of unspecified shoulder joint, initial encounter: Secondary | ICD-10-CM | POA: Diagnosis not present

## 2011-06-29 DIAGNOSIS — S46819A Strain of other muscles, fascia and tendons at shoulder and upper arm level, unspecified arm, initial encounter: Secondary | ICD-10-CM | POA: Diagnosis not present

## 2011-06-29 DIAGNOSIS — S43499A Other sprain of unspecified shoulder joint, initial encounter: Secondary | ICD-10-CM | POA: Diagnosis not present

## 2011-07-06 DIAGNOSIS — S43499A Other sprain of unspecified shoulder joint, initial encounter: Secondary | ICD-10-CM | POA: Diagnosis not present

## 2011-07-06 DIAGNOSIS — S46819A Strain of other muscles, fascia and tendons at shoulder and upper arm level, unspecified arm, initial encounter: Secondary | ICD-10-CM | POA: Diagnosis not present

## 2011-07-09 DIAGNOSIS — N183 Chronic kidney disease, stage 3 unspecified: Secondary | ICD-10-CM | POA: Diagnosis not present

## 2011-07-09 DIAGNOSIS — E1129 Type 2 diabetes mellitus with other diabetic kidney complication: Secondary | ICD-10-CM | POA: Diagnosis not present

## 2011-07-09 DIAGNOSIS — IMO0002 Reserved for concepts with insufficient information to code with codable children: Secondary | ICD-10-CM | POA: Diagnosis not present

## 2011-07-09 DIAGNOSIS — Z0000 Encounter for general adult medical examination without abnormal findings: Secondary | ICD-10-CM | POA: Diagnosis not present

## 2011-07-09 DIAGNOSIS — I10 Essential (primary) hypertension: Secondary | ICD-10-CM | POA: Diagnosis not present

## 2011-07-09 DIAGNOSIS — E7800 Pure hypercholesterolemia, unspecified: Secondary | ICD-10-CM | POA: Diagnosis not present

## 2011-07-09 DIAGNOSIS — M47817 Spondylosis without myelopathy or radiculopathy, lumbosacral region: Secondary | ICD-10-CM | POA: Diagnosis not present

## 2011-07-09 DIAGNOSIS — M5137 Other intervertebral disc degeneration, lumbosacral region: Secondary | ICD-10-CM | POA: Diagnosis not present

## 2011-07-10 DIAGNOSIS — S43499A Other sprain of unspecified shoulder joint, initial encounter: Secondary | ICD-10-CM | POA: Diagnosis not present

## 2011-07-10 DIAGNOSIS — S46819A Strain of other muscles, fascia and tendons at shoulder and upper arm level, unspecified arm, initial encounter: Secondary | ICD-10-CM | POA: Diagnosis not present

## 2011-07-13 DIAGNOSIS — S43499A Other sprain of unspecified shoulder joint, initial encounter: Secondary | ICD-10-CM | POA: Diagnosis not present

## 2011-07-16 DIAGNOSIS — S43499A Other sprain of unspecified shoulder joint, initial encounter: Secondary | ICD-10-CM | POA: Diagnosis not present

## 2011-07-21 DIAGNOSIS — S43499A Other sprain of unspecified shoulder joint, initial encounter: Secondary | ICD-10-CM | POA: Diagnosis not present

## 2011-07-22 DIAGNOSIS — S43499A Other sprain of unspecified shoulder joint, initial encounter: Secondary | ICD-10-CM | POA: Diagnosis not present

## 2011-07-22 DIAGNOSIS — S46819A Strain of other muscles, fascia and tendons at shoulder and upper arm level, unspecified arm, initial encounter: Secondary | ICD-10-CM | POA: Diagnosis not present

## 2011-07-22 HISTORY — PX: ROTATOR CUFF REPAIR: SHX139

## 2011-08-26 DIAGNOSIS — Z79899 Other long term (current) drug therapy: Secondary | ICD-10-CM | POA: Diagnosis not present

## 2011-08-26 DIAGNOSIS — E782 Mixed hyperlipidemia: Secondary | ICD-10-CM | POA: Diagnosis not present

## 2011-08-26 DIAGNOSIS — E785 Hyperlipidemia, unspecified: Secondary | ICD-10-CM | POA: Diagnosis not present

## 2011-08-28 DIAGNOSIS — M79609 Pain in unspecified limb: Secondary | ICD-10-CM | POA: Diagnosis not present

## 2011-08-28 DIAGNOSIS — E782 Mixed hyperlipidemia: Secondary | ICD-10-CM | POA: Diagnosis not present

## 2011-09-23 DIAGNOSIS — H2640 Unspecified secondary cataract: Secondary | ICD-10-CM | POA: Diagnosis not present

## 2011-09-23 DIAGNOSIS — H26499 Other secondary cataract, unspecified eye: Secondary | ICD-10-CM | POA: Insufficient documentation

## 2011-09-23 DIAGNOSIS — Z961 Presence of intraocular lens: Secondary | ICD-10-CM | POA: Diagnosis not present

## 2011-09-23 DIAGNOSIS — H18519 Endothelial corneal dystrophy, unspecified eye: Secondary | ICD-10-CM | POA: Diagnosis not present

## 2012-01-07 ENCOUNTER — Other Ambulatory Visit (HOSPITAL_COMMUNITY): Payer: Self-pay | Admitting: Family Medicine

## 2012-01-07 DIAGNOSIS — Z1231 Encounter for screening mammogram for malignant neoplasm of breast: Secondary | ICD-10-CM

## 2012-01-11 DIAGNOSIS — E1129 Type 2 diabetes mellitus with other diabetic kidney complication: Secondary | ICD-10-CM | POA: Diagnosis not present

## 2012-01-11 DIAGNOSIS — N183 Chronic kidney disease, stage 3 unspecified: Secondary | ICD-10-CM | POA: Diagnosis not present

## 2012-01-11 DIAGNOSIS — Z23 Encounter for immunization: Secondary | ICD-10-CM | POA: Diagnosis not present

## 2012-01-11 DIAGNOSIS — E782 Mixed hyperlipidemia: Secondary | ICD-10-CM | POA: Diagnosis not present

## 2012-01-11 DIAGNOSIS — F325 Major depressive disorder, single episode, in full remission: Secondary | ICD-10-CM | POA: Diagnosis not present

## 2012-02-12 ENCOUNTER — Ambulatory Visit (HOSPITAL_COMMUNITY)
Admission: RE | Admit: 2012-02-12 | Discharge: 2012-02-12 | Disposition: A | Discharge status: Home / Self Care | Payer: Medicare Other | Source: Ambulatory Visit | Attending: Family Medicine | Admitting: Family Medicine

## 2012-02-12 DIAGNOSIS — Z1231 Encounter for screening mammogram for malignant neoplasm of breast: Secondary | ICD-10-CM | POA: Diagnosis not present

## 2012-02-29 DIAGNOSIS — J40 Bronchitis, not specified as acute or chronic: Secondary | ICD-10-CM | POA: Diagnosis not present

## 2012-02-29 DIAGNOSIS — M545 Low back pain, unspecified: Secondary | ICD-10-CM | POA: Diagnosis not present

## 2012-03-09 DIAGNOSIS — H2640 Unspecified secondary cataract: Secondary | ICD-10-CM | POA: Diagnosis not present

## 2012-04-14 DIAGNOSIS — E559 Vitamin D deficiency, unspecified: Secondary | ICD-10-CM | POA: Diagnosis not present

## 2012-07-11 DIAGNOSIS — E1129 Type 2 diabetes mellitus with other diabetic kidney complication: Secondary | ICD-10-CM | POA: Diagnosis not present

## 2012-07-11 DIAGNOSIS — B351 Tinea unguium: Secondary | ICD-10-CM | POA: Diagnosis not present

## 2012-07-11 DIAGNOSIS — IMO0002 Reserved for concepts with insufficient information to code with codable children: Secondary | ICD-10-CM | POA: Diagnosis not present

## 2012-07-11 DIAGNOSIS — E782 Mixed hyperlipidemia: Secondary | ICD-10-CM | POA: Diagnosis not present

## 2012-07-11 DIAGNOSIS — Z79899 Other long term (current) drug therapy: Secondary | ICD-10-CM | POA: Diagnosis not present

## 2012-07-11 DIAGNOSIS — Z0000 Encounter for general adult medical examination without abnormal findings: Secondary | ICD-10-CM | POA: Diagnosis not present

## 2012-07-11 DIAGNOSIS — M545 Low back pain, unspecified: Secondary | ICD-10-CM | POA: Diagnosis not present

## 2012-07-11 DIAGNOSIS — M47817 Spondylosis without myelopathy or radiculopathy, lumbosacral region: Secondary | ICD-10-CM | POA: Diagnosis not present

## 2012-07-11 DIAGNOSIS — I10 Essential (primary) hypertension: Secondary | ICD-10-CM | POA: Diagnosis not present

## 2012-07-11 DIAGNOSIS — E559 Vitamin D deficiency, unspecified: Secondary | ICD-10-CM | POA: Diagnosis not present

## 2012-07-19 DIAGNOSIS — M79609 Pain in unspecified limb: Secondary | ICD-10-CM | POA: Diagnosis not present

## 2012-07-19 DIAGNOSIS — L608 Other nail disorders: Secondary | ICD-10-CM | POA: Diagnosis not present

## 2012-07-19 DIAGNOSIS — M659 Synovitis and tenosynovitis, unspecified: Secondary | ICD-10-CM | POA: Diagnosis not present

## 2012-07-19 DIAGNOSIS — E1159 Type 2 diabetes mellitus with other circulatory complications: Secondary | ICD-10-CM | POA: Diagnosis not present

## 2012-07-19 DIAGNOSIS — B351 Tinea unguium: Secondary | ICD-10-CM | POA: Diagnosis not present

## 2012-07-19 DIAGNOSIS — I739 Peripheral vascular disease, unspecified: Secondary | ICD-10-CM | POA: Diagnosis not present

## 2012-07-19 DIAGNOSIS — L84 Corns and callosities: Secondary | ICD-10-CM | POA: Diagnosis not present

## 2012-08-16 DIAGNOSIS — G609 Hereditary and idiopathic neuropathy, unspecified: Secondary | ICD-10-CM | POA: Diagnosis not present

## 2012-08-16 DIAGNOSIS — E119 Type 2 diabetes mellitus without complications: Secondary | ICD-10-CM | POA: Diagnosis not present

## 2012-08-16 DIAGNOSIS — B351 Tinea unguium: Secondary | ICD-10-CM | POA: Diagnosis not present

## 2012-08-16 DIAGNOSIS — IMO0002 Reserved for concepts with insufficient information to code with codable children: Secondary | ICD-10-CM | POA: Diagnosis not present

## 2012-08-16 DIAGNOSIS — M79609 Pain in unspecified limb: Secondary | ICD-10-CM | POA: Diagnosis not present

## 2012-09-13 DIAGNOSIS — M79609 Pain in unspecified limb: Secondary | ICD-10-CM | POA: Diagnosis not present

## 2012-09-13 DIAGNOSIS — B351 Tinea unguium: Secondary | ICD-10-CM | POA: Diagnosis not present

## 2012-09-30 DIAGNOSIS — E1159 Type 2 diabetes mellitus with other circulatory complications: Secondary | ICD-10-CM | POA: Diagnosis not present

## 2012-09-30 DIAGNOSIS — I739 Peripheral vascular disease, unspecified: Secondary | ICD-10-CM | POA: Diagnosis not present

## 2012-09-30 DIAGNOSIS — L84 Corns and callosities: Secondary | ICD-10-CM | POA: Diagnosis not present

## 2012-09-30 DIAGNOSIS — E1142 Type 2 diabetes mellitus with diabetic polyneuropathy: Secondary | ICD-10-CM | POA: Diagnosis not present

## 2012-09-30 DIAGNOSIS — L608 Other nail disorders: Secondary | ICD-10-CM | POA: Diagnosis not present

## 2012-10-11 DIAGNOSIS — Z1211 Encounter for screening for malignant neoplasm of colon: Secondary | ICD-10-CM | POA: Diagnosis not present

## 2012-11-01 DIAGNOSIS — B351 Tinea unguium: Secondary | ICD-10-CM | POA: Diagnosis not present

## 2012-11-01 DIAGNOSIS — M79609 Pain in unspecified limb: Secondary | ICD-10-CM | POA: Diagnosis not present

## 2012-11-14 DIAGNOSIS — E119 Type 2 diabetes mellitus without complications: Secondary | ICD-10-CM | POA: Diagnosis not present

## 2012-11-14 DIAGNOSIS — Z961 Presence of intraocular lens: Secondary | ICD-10-CM | POA: Diagnosis not present

## 2012-11-14 DIAGNOSIS — H18519 Endothelial corneal dystrophy, unspecified eye: Secondary | ICD-10-CM | POA: Diagnosis not present

## 2012-12-02 DIAGNOSIS — Z23 Encounter for immunization: Secondary | ICD-10-CM | POA: Diagnosis not present

## 2012-12-05 ENCOUNTER — Ambulatory Visit: Payer: PRIVATE HEALTH INSURANCE | Admitting: Cardiovascular Disease

## 2012-12-19 ENCOUNTER — Encounter: Payer: Self-pay | Admitting: Cardiovascular Disease

## 2012-12-19 ENCOUNTER — Ambulatory Visit (INDEPENDENT_AMBULATORY_CARE_PROVIDER_SITE_OTHER): Payer: Medicare Other | Admitting: Cardiovascular Disease

## 2012-12-19 DIAGNOSIS — M79609 Pain in unspecified limb: Secondary | ICD-10-CM

## 2012-12-19 DIAGNOSIS — I10 Essential (primary) hypertension: Secondary | ICD-10-CM | POA: Diagnosis not present

## 2012-12-19 DIAGNOSIS — M79604 Pain in right leg: Secondary | ICD-10-CM | POA: Insufficient documentation

## 2012-12-19 NOTE — Assessment & Plan Note (Signed)
The patient was referred by Dr. Illene Bolus from [facility] for evaluation of bilateral lower extremity discomfort. She does have a history of plantar fasciitis. She says her pain is worse when she lies down at night and her legs feel numb. She does not have typical claudication. She has 2+ pedal pulses on exam. I am going to get bilateral lower extremity arterial Doppler studies which I suspect will be normal and we'll see her back as needed otherwise. I have suggested that she be referred to a neurosurgeon for evaluation of radiculopathy

## 2012-12-19 NOTE — Assessment & Plan Note (Signed)
Well-controlled on current medications 

## 2012-12-19 NOTE — Patient Instructions (Signed)
  We will see you back in follow up only as needed, based on the doppler results.   Dr Allyson Sabal has ordered lower extremity arterial dopplers.

## 2012-12-19 NOTE — Progress Notes (Signed)
12/19/2012 [name]   1940/06/26  [id number]  Primary Physician Carlus Pavlov, MD Primary Cardiologist: Runell Gess MD [name]   HPI:  Ms. Chalker is a 72 year old severely overweight divorced African American female mother of 3, and her mother tonight grandchildren referred by Dr. Illene Bolus from [facility] for evaluation of lower extremity discomfort. Her cardiac risk factor profile is positive for treated hypertension and hyperlipidemia. She does not smoke nor is he diabetic. There is no family history of heart disease. She's never had a heart attack or stroke and denies chest pain or shortness of breath. She does have bilateral lower joint discomfort possibly at night when she is lying down but denies typical claudication. She has had bilateral total knee replacements by Dr. Trudee Grip .   Current Outpatient Prescriptions  Medication Sig Dispense Refill  . Aliskiren-Hydrochlorothiazide (TEKTURNA HCT) 300-12.5 MG TABS Take 1 tablet by mouth daily.      Marland Kitchen amLODipine (NORVASC) 10 MG tablet Take 10 mg by mouth daily.      Marland Kitchen amoxicillin (AMOXIL) 500 MG capsule Take 2,000 mg by mouth as needed (30 to 60 minutes before a dental appointment.).      Marland Kitchen aspirin EC 81 MG tablet Take 81 mg by mouth daily as needed for pain.      Marland Kitchen b complex vitamins tablet Take 1 tablet by mouth daily.      . Cholecalciferol (VITAMIN D3) 2000 UNITS capsule Take 2,000 Units by mouth daily.      . ciclesonide (OMNARIS) 50 MCG/ACT nasal spray Place 2 sprays into both nostrils as needed for allergies.      Marland Kitchen diclofenac sodium (VOLTAREN) 1 % GEL Apply 2 g topically as needed.      . fluticasone (VERAMYST) 27.5 MCG/SPRAY nasal spray Place 2 sprays into the nose as needed for rhinitis.      Marland Kitchen ketotifen (RA ANTIHISTAMINE EYE DROPS) 0.025 % ophthalmic solution Place 1 drop into both eyes as needed.      . nebivolol (BYSTOLIC) 5 MG tablet Take 5 mg by mouth daily.      Bertram Gala Glycol-Propyl Glycol (SYSTANE ULTRA) 0.4-0.3 % SOLN Apply 1 drop to eye as needed.      . simvastatin (ZOCOR) 20 MG tablet Take 20 mg by mouth at bedtime.      . terbinafine (LAMISIL) 250 MG tablet Take 250 mg by mouth daily.      Marland Kitchen trolamine salicylate (ASPERCREME) 10 % cream Apply 1 application topically as needed.       No current facility-administered medications for this visit.    Allergies  Allergen Reactions  . Codeine     Shakes   . Diovan [Valsartan]     Shaking  . Metoprolol Tartrate     REACTION: actually allergy to beta blocker  . Zocor [Simvastatin]     Alopecia    History   Social History  . Marital Status: Single    Spouse Name: N/A    Number of Children: N/A  . Years of Education: N/A   Occupational History  . Not on file.   Social History Main Topics  . Smoking status: Never Smoker   . Smokeless tobacco: Never Used  . Alcohol Use: No  . Drug Use: Not on file  . Sexual Activity: Not on file   Other Topics Concern  . Not on file   Social History Narrative  . No narrative on file  Review of Systems: General: negative for chills, fever, night sweats or weight changes.  Cardiovascular: negative for chest pain, dyspnea on exertion, edema, orthopnea, palpitations, paroxysmal nocturnal dyspnea or shortness of breath Dermatological: negative for rash Respiratory: negative for cough or wheezing Urologic: negative for hematuria Abdominal: negative for nausea, vomiting, diarrhea, bright red blood per rectum, melena, or hematemesis Neurologic: negative for visual changes, syncope, or dizziness All other systems reviewed and are otherwise negative except as noted above.    Blood pressure 128/90, pulse 65, height 5\' 4"  (1.626 m), weight 249 lb 9.6 oz (113.218 kg).  General appearance: alert and no distress Neck: no adenopathy, no carotid bruit, no JVD, supple, symmetrical, trachea midline and thyroid not enlarged, symmetric, no  tenderness/mass/nodules Lungs: clear to auscultation bilaterally Heart: regular rate and rhythm, S1, S2 normal, no murmur, click, rub or gallop Abdomen: soft, non-tender; bowel sounds normal; no masses,  no organomegaly Extremities: extremities normal, atraumatic, no cyanosis or edema Pulses: 2+ and symmetric  EKG normal sinus rhythm at 65 without ST or T wave changes  ASSESSMENT AND PLAN:   Bilateral leg pain The patient was referred by Dr. Illene Bolus from [facility] for evaluation of bilateral lower extremity discomfort. She does have a history of plantar fasciitis. She says her pain is worse when she lies down at night and her legs feel numb. She does not have typical claudication. She has 2+ pedal pulses on exam. I am going to get bilateral lower extremity arterial Doppler studies which I suspect will be normal and we'll see her back as needed otherwise. I have suggested that she be referred to a neurosurgeon for evaluation of radiculopathy      Runell Gess MD [facility], [facility] 12/19/2012 9:55 AM

## 2012-12-20 ENCOUNTER — Encounter: Payer: Self-pay | Admitting: Cardiovascular Disease

## 2013-01-10 ENCOUNTER — Encounter (HOSPITAL_COMMUNITY): Payer: Medicare Other

## 2013-01-10 ENCOUNTER — Ambulatory Visit (HOSPITAL_COMMUNITY)
Admission: RE | Admit: 2013-01-10 | Discharge: 2013-01-10 | Disposition: A | Discharge status: Home / Self Care | Payer: Medicare Other | Source: Ambulatory Visit | Attending: Internal Medicine | Admitting: Internal Medicine

## 2013-01-10 DIAGNOSIS — M79609 Pain in unspecified limb: Secondary | ICD-10-CM | POA: Diagnosis not present

## 2013-01-10 NOTE — Progress Notes (Signed)
Lower Extremity Arterial Duplex Completed. °Brianna L Mazza,RVT °

## 2013-01-12 DIAGNOSIS — K219 Gastro-esophageal reflux disease without esophagitis: Secondary | ICD-10-CM | POA: Diagnosis not present

## 2013-01-12 DIAGNOSIS — M1710 Unilateral primary osteoarthritis, unspecified knee: Secondary | ICD-10-CM | POA: Diagnosis not present

## 2013-01-12 DIAGNOSIS — E782 Mixed hyperlipidemia: Secondary | ICD-10-CM | POA: Diagnosis not present

## 2013-01-12 DIAGNOSIS — E1129 Type 2 diabetes mellitus with other diabetic kidney complication: Secondary | ICD-10-CM | POA: Diagnosis not present

## 2013-01-12 DIAGNOSIS — IMO0001 Reserved for inherently not codable concepts without codable children: Secondary | ICD-10-CM | POA: Diagnosis not present

## 2013-01-12 DIAGNOSIS — J309 Allergic rhinitis, unspecified: Secondary | ICD-10-CM | POA: Diagnosis not present

## 2013-01-12 DIAGNOSIS — N183 Chronic kidney disease, stage 3 unspecified: Secondary | ICD-10-CM | POA: Diagnosis not present

## 2013-01-12 DIAGNOSIS — I10 Essential (primary) hypertension: Secondary | ICD-10-CM | POA: Diagnosis not present

## 2013-01-12 DIAGNOSIS — IMO0002 Reserved for concepts with insufficient information to code with codable children: Secondary | ICD-10-CM | POA: Diagnosis not present

## 2013-01-16 ENCOUNTER — Encounter: Payer: Self-pay | Admitting: *Deleted

## 2013-01-17 DIAGNOSIS — B351 Tinea unguium: Secondary | ICD-10-CM | POA: Diagnosis not present

## 2013-01-17 DIAGNOSIS — M79609 Pain in unspecified limb: Secondary | ICD-10-CM | POA: Diagnosis not present

## 2013-02-01 ENCOUNTER — Other Ambulatory Visit (HOSPITAL_COMMUNITY): Payer: Self-pay | Admitting: Family Medicine

## 2013-02-01 DIAGNOSIS — Z1231 Encounter for screening mammogram for malignant neoplasm of breast: Secondary | ICD-10-CM

## 2013-02-15 DIAGNOSIS — S43499A Other sprain of unspecified shoulder joint, initial encounter: Secondary | ICD-10-CM | POA: Diagnosis not present

## 2013-02-22 ENCOUNTER — Ambulatory Visit (HOSPITAL_COMMUNITY)
Admission: RE | Admit: 2013-02-22 | Discharge: 2013-02-22 | Disposition: A | Discharge status: Home / Self Care | Payer: Medicare Other | Source: Ambulatory Visit | Attending: Family Medicine | Admitting: Family Medicine

## 2013-02-22 DIAGNOSIS — Z1231 Encounter for screening mammogram for malignant neoplasm of breast: Secondary | ICD-10-CM

## 2013-02-24 DIAGNOSIS — M62838 Other muscle spasm: Secondary | ICD-10-CM | POA: Diagnosis not present

## 2013-02-24 DIAGNOSIS — K219 Gastro-esophageal reflux disease without esophagitis: Secondary | ICD-10-CM | POA: Diagnosis not present

## 2013-03-16 DIAGNOSIS — M47817 Spondylosis without myelopathy or radiculopathy, lumbosacral region: Secondary | ICD-10-CM | POA: Diagnosis not present

## 2013-03-16 DIAGNOSIS — M539 Dorsopathy, unspecified: Secondary | ICD-10-CM | POA: Diagnosis not present

## 2013-03-16 DIAGNOSIS — M5137 Other intervertebral disc degeneration, lumbosacral region: Secondary | ICD-10-CM | POA: Diagnosis not present

## 2013-03-16 DIAGNOSIS — M542 Cervicalgia: Secondary | ICD-10-CM | POA: Diagnosis not present

## 2013-05-29 DIAGNOSIS — B351 Tinea unguium: Secondary | ICD-10-CM | POA: Diagnosis not present

## 2013-05-29 DIAGNOSIS — M79609 Pain in unspecified limb: Secondary | ICD-10-CM | POA: Diagnosis not present

## 2013-06-30 DIAGNOSIS — I8390 Asymptomatic varicose veins of unspecified lower extremity: Secondary | ICD-10-CM | POA: Diagnosis not present

## 2013-06-30 DIAGNOSIS — I10 Essential (primary) hypertension: Secondary | ICD-10-CM | POA: Diagnosis not present

## 2013-09-22 DIAGNOSIS — I10 Essential (primary) hypertension: Secondary | ICD-10-CM | POA: Diagnosis not present

## 2013-09-22 DIAGNOSIS — R350 Frequency of micturition: Secondary | ICD-10-CM | POA: Diagnosis not present

## 2013-09-22 DIAGNOSIS — N183 Chronic kidney disease, stage 3 unspecified: Secondary | ICD-10-CM | POA: Diagnosis not present

## 2013-09-22 DIAGNOSIS — E559 Vitamin D deficiency, unspecified: Secondary | ICD-10-CM | POA: Diagnosis not present

## 2013-09-22 DIAGNOSIS — Z6841 Body Mass Index (BMI) 40.0 and over, adult: Secondary | ICD-10-CM | POA: Diagnosis not present

## 2013-09-22 DIAGNOSIS — E1129 Type 2 diabetes mellitus with other diabetic kidney complication: Secondary | ICD-10-CM | POA: Diagnosis not present

## 2013-09-22 DIAGNOSIS — E782 Mixed hyperlipidemia: Secondary | ICD-10-CM | POA: Diagnosis not present

## 2013-09-22 DIAGNOSIS — Z0000 Encounter for general adult medical examination without abnormal findings: Secondary | ICD-10-CM | POA: Diagnosis not present

## 2013-09-22 DIAGNOSIS — J309 Allergic rhinitis, unspecified: Secondary | ICD-10-CM | POA: Diagnosis not present

## 2013-10-23 DIAGNOSIS — E1159 Type 2 diabetes mellitus with other circulatory complications: Secondary | ICD-10-CM | POA: Diagnosis not present

## 2013-10-23 DIAGNOSIS — L608 Other nail disorders: Secondary | ICD-10-CM | POA: Diagnosis not present

## 2013-10-23 DIAGNOSIS — I739 Peripheral vascular disease, unspecified: Secondary | ICD-10-CM | POA: Diagnosis not present

## 2013-10-23 DIAGNOSIS — L84 Corns and callosities: Secondary | ICD-10-CM | POA: Diagnosis not present

## 2013-10-25 DIAGNOSIS — Z471 Aftercare following joint replacement surgery: Secondary | ICD-10-CM | POA: Diagnosis not present

## 2013-10-25 DIAGNOSIS — Z96659 Presence of unspecified artificial knee joint: Secondary | ICD-10-CM | POA: Diagnosis not present

## 2013-10-27 DIAGNOSIS — R29898 Other symptoms and signs involving the musculoskeletal system: Secondary | ICD-10-CM | POA: Diagnosis not present

## 2013-11-03 DIAGNOSIS — R29898 Other symptoms and signs involving the musculoskeletal system: Secondary | ICD-10-CM | POA: Diagnosis not present

## 2013-11-07 DIAGNOSIS — R29898 Other symptoms and signs involving the musculoskeletal system: Secondary | ICD-10-CM | POA: Diagnosis not present

## 2013-11-17 DIAGNOSIS — R29898 Other symptoms and signs involving the musculoskeletal system: Secondary | ICD-10-CM | POA: Diagnosis not present

## 2013-11-20 DIAGNOSIS — R29898 Other symptoms and signs involving the musculoskeletal system: Secondary | ICD-10-CM | POA: Diagnosis not present

## 2013-11-22 DIAGNOSIS — M6281 Muscle weakness (generalized): Secondary | ICD-10-CM | POA: Diagnosis not present

## 2013-11-24 DIAGNOSIS — M6281 Muscle weakness (generalized): Secondary | ICD-10-CM | POA: Diagnosis not present

## 2013-11-29 DIAGNOSIS — H1851 Endothelial corneal dystrophy: Secondary | ICD-10-CM | POA: Diagnosis not present

## 2013-11-29 DIAGNOSIS — H52203 Unspecified astigmatism, bilateral: Secondary | ICD-10-CM | POA: Diagnosis not present

## 2013-11-29 DIAGNOSIS — G43809 Other migraine, not intractable, without status migrainosus: Secondary | ICD-10-CM | POA: Diagnosis not present

## 2013-11-29 DIAGNOSIS — H01003 Unspecified blepharitis right eye, unspecified eyelid: Secondary | ICD-10-CM | POA: Diagnosis not present

## 2013-11-29 DIAGNOSIS — H18519 Endothelial corneal dystrophy, unspecified eye: Secondary | ICD-10-CM | POA: Insufficient documentation

## 2013-11-29 DIAGNOSIS — Z961 Presence of intraocular lens: Secondary | ICD-10-CM | POA: Diagnosis not present

## 2013-11-29 DIAGNOSIS — IMO0001 Reserved for inherently not codable concepts without codable children: Secondary | ICD-10-CM | POA: Insufficient documentation

## 2013-11-29 DIAGNOSIS — H01006 Unspecified blepharitis left eye, unspecified eyelid: Secondary | ICD-10-CM | POA: Diagnosis not present

## 2013-11-29 DIAGNOSIS — Z0389 Encounter for observation for other suspected diseases and conditions ruled out: Secondary | ICD-10-CM | POA: Diagnosis not present

## 2013-11-29 DIAGNOSIS — G43109 Migraine with aura, not intractable, without status migrainosus: Secondary | ICD-10-CM | POA: Insufficient documentation

## 2013-12-01 DIAGNOSIS — M6281 Muscle weakness (generalized): Secondary | ICD-10-CM | POA: Diagnosis not present

## 2013-12-19 DIAGNOSIS — Z96653 Presence of artificial knee joint, bilateral: Secondary | ICD-10-CM | POA: Diagnosis not present

## 2013-12-19 DIAGNOSIS — Z96651 Presence of right artificial knee joint: Secondary | ICD-10-CM | POA: Diagnosis not present

## 2013-12-19 DIAGNOSIS — Z471 Aftercare following joint replacement surgery: Secondary | ICD-10-CM | POA: Diagnosis not present

## 2013-12-19 DIAGNOSIS — Z96652 Presence of left artificial knee joint: Secondary | ICD-10-CM | POA: Diagnosis not present

## 2013-12-26 DIAGNOSIS — Z23 Encounter for immunization: Secondary | ICD-10-CM | POA: Diagnosis not present

## 2014-01-03 DIAGNOSIS — R05 Cough: Secondary | ICD-10-CM | POA: Diagnosis not present

## 2014-01-25 ENCOUNTER — Other Ambulatory Visit (HOSPITAL_COMMUNITY): Payer: Self-pay | Admitting: Family Medicine

## 2014-01-25 DIAGNOSIS — Z1231 Encounter for screening mammogram for malignant neoplasm of breast: Secondary | ICD-10-CM

## 2014-02-19 DIAGNOSIS — M19042 Primary osteoarthritis, left hand: Secondary | ICD-10-CM | POA: Diagnosis not present

## 2014-02-26 ENCOUNTER — Ambulatory Visit (HOSPITAL_COMMUNITY)
Admission: RE | Admit: 2014-02-26 | Discharge: 2014-02-26 | Disposition: A | Discharge status: Home / Self Care | Payer: Medicare Other | Source: Ambulatory Visit | Attending: Family Medicine | Admitting: Family Medicine

## 2014-02-26 DIAGNOSIS — Z1231 Encounter for screening mammogram for malignant neoplasm of breast: Secondary | ICD-10-CM | POA: Diagnosis not present

## 2014-03-26 DIAGNOSIS — M4726 Other spondylosis with radiculopathy, lumbar region: Secondary | ICD-10-CM | POA: Diagnosis not present

## 2014-03-26 DIAGNOSIS — I10 Essential (primary) hypertension: Secondary | ICD-10-CM | POA: Diagnosis not present

## 2014-03-26 DIAGNOSIS — N183 Chronic kidney disease, stage 3 (moderate): Secondary | ICD-10-CM | POA: Diagnosis not present

## 2014-03-26 DIAGNOSIS — M545 Low back pain: Secondary | ICD-10-CM | POA: Diagnosis not present

## 2014-03-26 DIAGNOSIS — E782 Mixed hyperlipidemia: Secondary | ICD-10-CM | POA: Diagnosis not present

## 2014-03-26 DIAGNOSIS — K219 Gastro-esophageal reflux disease without esophagitis: Secondary | ICD-10-CM | POA: Diagnosis not present

## 2014-03-26 DIAGNOSIS — E1122 Type 2 diabetes mellitus with diabetic chronic kidney disease: Secondary | ICD-10-CM | POA: Diagnosis not present

## 2014-03-26 DIAGNOSIS — Z23 Encounter for immunization: Secondary | ICD-10-CM | POA: Diagnosis not present

## 2014-03-26 DIAGNOSIS — I351 Nonrheumatic aortic (valve) insufficiency: Secondary | ICD-10-CM | POA: Diagnosis not present

## 2014-03-26 DIAGNOSIS — E559 Vitamin D deficiency, unspecified: Secondary | ICD-10-CM | POA: Diagnosis not present

## 2014-03-26 DIAGNOSIS — M797 Fibromyalgia: Secondary | ICD-10-CM | POA: Diagnosis not present

## 2014-03-26 DIAGNOSIS — M179 Osteoarthritis of knee, unspecified: Secondary | ICD-10-CM | POA: Diagnosis not present

## 2014-05-21 DIAGNOSIS — R05 Cough: Secondary | ICD-10-CM | POA: Diagnosis not present

## 2014-08-01 DIAGNOSIS — M549 Dorsalgia, unspecified: Secondary | ICD-10-CM | POA: Diagnosis not present

## 2014-08-01 DIAGNOSIS — M25519 Pain in unspecified shoulder: Secondary | ICD-10-CM | POA: Diagnosis not present

## 2014-08-02 ENCOUNTER — Other Ambulatory Visit: Payer: Self-pay | Admitting: Family Medicine

## 2014-08-02 ENCOUNTER — Ambulatory Visit
Admission: RE | Admit: 2014-08-02 | Discharge: 2014-08-02 | Disposition: A | Discharge status: Home / Self Care | Payer: Medicare Other | Source: Ambulatory Visit | Attending: Family Medicine | Admitting: Family Medicine

## 2014-08-02 DIAGNOSIS — S3992XA Unspecified injury of lower back, initial encounter: Secondary | ICD-10-CM | POA: Diagnosis not present

## 2014-08-02 DIAGNOSIS — M5489 Other dorsalgia: Secondary | ICD-10-CM

## 2014-08-02 DIAGNOSIS — M25511 Pain in right shoulder: Secondary | ICD-10-CM

## 2014-08-02 DIAGNOSIS — M545 Low back pain: Secondary | ICD-10-CM | POA: Diagnosis not present

## 2014-08-02 DIAGNOSIS — S4991XA Unspecified injury of right shoulder and upper arm, initial encounter: Secondary | ICD-10-CM | POA: Diagnosis not present

## 2014-10-12 DIAGNOSIS — E559 Vitamin D deficiency, unspecified: Secondary | ICD-10-CM | POA: Diagnosis not present

## 2014-10-12 DIAGNOSIS — E1122 Type 2 diabetes mellitus with diabetic chronic kidney disease: Secondary | ICD-10-CM | POA: Diagnosis not present

## 2014-10-12 DIAGNOSIS — N183 Chronic kidney disease, stage 3 (moderate): Secondary | ICD-10-CM | POA: Diagnosis not present

## 2014-10-12 DIAGNOSIS — Z0000 Encounter for general adult medical examination without abnormal findings: Secondary | ICD-10-CM | POA: Diagnosis not present

## 2014-10-12 DIAGNOSIS — Z79899 Other long term (current) drug therapy: Secondary | ICD-10-CM | POA: Diagnosis not present

## 2014-10-22 DIAGNOSIS — L603 Nail dystrophy: Secondary | ICD-10-CM | POA: Diagnosis not present

## 2014-10-22 DIAGNOSIS — L84 Corns and callosities: Secondary | ICD-10-CM | POA: Diagnosis not present

## 2014-10-22 DIAGNOSIS — I739 Peripheral vascular disease, unspecified: Secondary | ICD-10-CM | POA: Diagnosis not present

## 2014-12-03 DIAGNOSIS — Z961 Presence of intraocular lens: Secondary | ICD-10-CM | POA: Diagnosis not present

## 2014-12-03 DIAGNOSIS — E119 Type 2 diabetes mellitus without complications: Secondary | ICD-10-CM | POA: Diagnosis not present

## 2014-12-10 DIAGNOSIS — Z23 Encounter for immunization: Secondary | ICD-10-CM | POA: Diagnosis not present

## 2015-01-03 DIAGNOSIS — E1151 Type 2 diabetes mellitus with diabetic peripheral angiopathy without gangrene: Secondary | ICD-10-CM | POA: Diagnosis not present

## 2015-01-03 DIAGNOSIS — L84 Corns and callosities: Secondary | ICD-10-CM | POA: Diagnosis not present

## 2015-01-03 DIAGNOSIS — L603 Nail dystrophy: Secondary | ICD-10-CM | POA: Diagnosis not present

## 2015-01-03 DIAGNOSIS — I739 Peripheral vascular disease, unspecified: Secondary | ICD-10-CM | POA: Diagnosis not present

## 2015-01-25 DIAGNOSIS — H6120 Impacted cerumen, unspecified ear: Secondary | ICD-10-CM | POA: Diagnosis not present

## 2015-02-05 ENCOUNTER — Other Ambulatory Visit: Payer: Self-pay

## 2015-02-05 DIAGNOSIS — Z1231 Encounter for screening mammogram for malignant neoplasm of breast: Secondary | ICD-10-CM

## 2015-03-07 ENCOUNTER — Ambulatory Visit
Admission: RE | Admit: 2015-03-07 | Discharge: 2015-03-07 | Disposition: A | Discharge status: Home / Self Care | Payer: Medicare Other | Source: Ambulatory Visit

## 2015-03-07 DIAGNOSIS — Z1231 Encounter for screening mammogram for malignant neoplasm of breast: Secondary | ICD-10-CM | POA: Diagnosis not present

## 2015-03-08 DIAGNOSIS — H9113 Presbycusis, bilateral: Secondary | ICD-10-CM | POA: Diagnosis not present

## 2015-03-08 DIAGNOSIS — M1990 Unspecified osteoarthritis, unspecified site: Secondary | ICD-10-CM | POA: Diagnosis not present

## 2015-03-08 DIAGNOSIS — E1122 Type 2 diabetes mellitus with diabetic chronic kidney disease: Secondary | ICD-10-CM | POA: Diagnosis not present

## 2015-03-08 DIAGNOSIS — E559 Vitamin D deficiency, unspecified: Secondary | ICD-10-CM | POA: Diagnosis not present

## 2015-04-02 DIAGNOSIS — M25572 Pain in left ankle and joints of left foot: Secondary | ICD-10-CM | POA: Diagnosis not present

## 2015-04-02 DIAGNOSIS — L84 Corns and callosities: Secondary | ICD-10-CM | POA: Diagnosis not present

## 2015-04-02 DIAGNOSIS — E1151 Type 2 diabetes mellitus with diabetic peripheral angiopathy without gangrene: Secondary | ICD-10-CM | POA: Diagnosis not present

## 2015-04-02 DIAGNOSIS — M2042 Other hammer toe(s) (acquired), left foot: Secondary | ICD-10-CM | POA: Diagnosis not present

## 2015-04-02 DIAGNOSIS — I739 Peripheral vascular disease, unspecified: Secondary | ICD-10-CM | POA: Diagnosis not present

## 2015-04-02 DIAGNOSIS — M2041 Other hammer toe(s) (acquired), right foot: Secondary | ICD-10-CM | POA: Diagnosis not present

## 2015-04-17 DIAGNOSIS — M5136 Other intervertebral disc degeneration, lumbar region: Secondary | ICD-10-CM | POA: Diagnosis not present

## 2015-04-17 DIAGNOSIS — E782 Mixed hyperlipidemia: Secondary | ICD-10-CM | POA: Diagnosis not present

## 2015-04-17 DIAGNOSIS — E1122 Type 2 diabetes mellitus with diabetic chronic kidney disease: Secondary | ICD-10-CM | POA: Diagnosis not present

## 2015-04-17 DIAGNOSIS — M4726 Other spondylosis with radiculopathy, lumbar region: Secondary | ICD-10-CM | POA: Diagnosis not present

## 2015-04-17 DIAGNOSIS — N183 Chronic kidney disease, stage 3 (moderate): Secondary | ICD-10-CM | POA: Diagnosis not present

## 2015-04-17 DIAGNOSIS — I10 Essential (primary) hypertension: Secondary | ICD-10-CM | POA: Diagnosis not present

## 2015-05-23 DIAGNOSIS — Z780 Asymptomatic menopausal state: Secondary | ICD-10-CM | POA: Diagnosis not present

## 2015-06-12 DIAGNOSIS — M549 Dorsalgia, unspecified: Secondary | ICD-10-CM | POA: Diagnosis not present

## 2015-06-28 DIAGNOSIS — M4726 Other spondylosis with radiculopathy, lumbar region: Secondary | ICD-10-CM | POA: Diagnosis not present

## 2015-06-28 DIAGNOSIS — E782 Mixed hyperlipidemia: Secondary | ICD-10-CM | POA: Diagnosis not present

## 2015-06-28 DIAGNOSIS — E1122 Type 2 diabetes mellitus with diabetic chronic kidney disease: Secondary | ICD-10-CM | POA: Diagnosis not present

## 2015-06-28 DIAGNOSIS — N183 Chronic kidney disease, stage 3 (moderate): Secondary | ICD-10-CM | POA: Diagnosis not present

## 2015-06-28 DIAGNOSIS — Z79899 Other long term (current) drug therapy: Secondary | ICD-10-CM | POA: Diagnosis not present

## 2015-06-28 DIAGNOSIS — L84 Corns and callosities: Secondary | ICD-10-CM | POA: Diagnosis not present

## 2015-09-13 DIAGNOSIS — Z6841 Body Mass Index (BMI) 40.0 and over, adult: Secondary | ICD-10-CM | POA: Diagnosis not present

## 2015-09-13 DIAGNOSIS — L600 Ingrowing nail: Secondary | ICD-10-CM | POA: Diagnosis not present

## 2015-09-13 DIAGNOSIS — M1990 Unspecified osteoarthritis, unspecified site: Secondary | ICD-10-CM | POA: Diagnosis not present

## 2015-09-13 DIAGNOSIS — F325 Major depressive disorder, single episode, in full remission: Secondary | ICD-10-CM | POA: Diagnosis not present

## 2015-09-13 DIAGNOSIS — M5416 Radiculopathy, lumbar region: Secondary | ICD-10-CM | POA: Diagnosis not present

## 2015-09-13 DIAGNOSIS — M797 Fibromyalgia: Secondary | ICD-10-CM | POA: Diagnosis not present

## 2015-09-17 DIAGNOSIS — M79609 Pain in unspecified limb: Secondary | ICD-10-CM | POA: Diagnosis not present

## 2015-09-17 DIAGNOSIS — E1151 Type 2 diabetes mellitus with diabetic peripheral angiopathy without gangrene: Secondary | ICD-10-CM | POA: Diagnosis not present

## 2015-09-17 DIAGNOSIS — B351 Tinea unguium: Secondary | ICD-10-CM | POA: Diagnosis not present

## 2015-09-17 DIAGNOSIS — L600 Ingrowing nail: Secondary | ICD-10-CM | POA: Diagnosis not present

## 2015-09-17 DIAGNOSIS — I739 Peripheral vascular disease, unspecified: Secondary | ICD-10-CM | POA: Diagnosis not present

## 2015-09-17 DIAGNOSIS — L603 Nail dystrophy: Secondary | ICD-10-CM | POA: Diagnosis not present

## 2015-10-30 DIAGNOSIS — E1122 Type 2 diabetes mellitus with diabetic chronic kidney disease: Secondary | ICD-10-CM | POA: Diagnosis not present

## 2015-10-30 DIAGNOSIS — N183 Chronic kidney disease, stage 3 (moderate): Secondary | ICD-10-CM | POA: Diagnosis not present

## 2015-10-30 DIAGNOSIS — Z79899 Other long term (current) drug therapy: Secondary | ICD-10-CM | POA: Diagnosis not present

## 2015-10-30 DIAGNOSIS — Z23 Encounter for immunization: Secondary | ICD-10-CM | POA: Diagnosis not present

## 2015-10-30 DIAGNOSIS — K219 Gastro-esophageal reflux disease without esophagitis: Secondary | ICD-10-CM | POA: Diagnosis not present

## 2015-10-30 DIAGNOSIS — E559 Vitamin D deficiency, unspecified: Secondary | ICD-10-CM | POA: Diagnosis not present

## 2015-10-30 DIAGNOSIS — Z0000 Encounter for general adult medical examination without abnormal findings: Secondary | ICD-10-CM | POA: Diagnosis not present

## 2015-11-22 DIAGNOSIS — M1990 Unspecified osteoarthritis, unspecified site: Secondary | ICD-10-CM | POA: Diagnosis not present

## 2015-11-22 DIAGNOSIS — M549 Dorsalgia, unspecified: Secondary | ICD-10-CM | POA: Diagnosis not present

## 2015-12-04 DIAGNOSIS — E119 Type 2 diabetes mellitus without complications: Secondary | ICD-10-CM | POA: Diagnosis not present

## 2015-12-04 DIAGNOSIS — Z961 Presence of intraocular lens: Secondary | ICD-10-CM | POA: Diagnosis not present

## 2015-12-16 DIAGNOSIS — D485 Neoplasm of uncertain behavior of skin: Secondary | ICD-10-CM | POA: Diagnosis not present

## 2015-12-16 DIAGNOSIS — M792 Neuralgia and neuritis, unspecified: Secondary | ICD-10-CM | POA: Diagnosis not present

## 2015-12-30 DIAGNOSIS — M792 Neuralgia and neuritis, unspecified: Secondary | ICD-10-CM | POA: Diagnosis not present

## 2015-12-30 DIAGNOSIS — M25572 Pain in left ankle and joints of left foot: Secondary | ICD-10-CM | POA: Diagnosis not present

## 2015-12-30 DIAGNOSIS — M25571 Pain in right ankle and joints of right foot: Secondary | ICD-10-CM | POA: Diagnosis not present

## 2015-12-30 DIAGNOSIS — G609 Hereditary and idiopathic neuropathy, unspecified: Secondary | ICD-10-CM | POA: Diagnosis not present

## 2016-01-20 DIAGNOSIS — M65872 Other synovitis and tenosynovitis, left ankle and foot: Secondary | ICD-10-CM | POA: Diagnosis not present

## 2016-01-20 DIAGNOSIS — M12272 Villonodular synovitis (pigmented), left ankle and foot: Secondary | ICD-10-CM | POA: Diagnosis not present

## 2016-02-03 ENCOUNTER — Other Ambulatory Visit: Payer: Self-pay | Admitting: Family Medicine

## 2016-02-03 DIAGNOSIS — Z1231 Encounter for screening mammogram for malignant neoplasm of breast: Secondary | ICD-10-CM

## 2016-03-04 DIAGNOSIS — M722 Plantar fascial fibromatosis: Secondary | ICD-10-CM | POA: Diagnosis not present

## 2016-03-04 DIAGNOSIS — M71572 Other bursitis, not elsewhere classified, left ankle and foot: Secondary | ICD-10-CM | POA: Diagnosis not present

## 2016-03-10 ENCOUNTER — Ambulatory Visit
Admission: RE | Admit: 2016-03-10 | Discharge: 2016-03-10 | Disposition: A | Discharge status: Home / Self Care | Payer: Medicare Other | Source: Ambulatory Visit | Attending: Family Medicine | Admitting: Family Medicine

## 2016-03-10 DIAGNOSIS — Z1231 Encounter for screening mammogram for malignant neoplasm of breast: Secondary | ICD-10-CM

## 2016-03-17 DIAGNOSIS — E7800 Pure hypercholesterolemia, unspecified: Secondary | ICD-10-CM | POA: Diagnosis not present

## 2016-04-03 DIAGNOSIS — I739 Peripheral vascular disease, unspecified: Secondary | ICD-10-CM | POA: Diagnosis not present

## 2016-04-03 DIAGNOSIS — E1151 Type 2 diabetes mellitus with diabetic peripheral angiopathy without gangrene: Secondary | ICD-10-CM | POA: Diagnosis not present

## 2016-04-03 DIAGNOSIS — L603 Nail dystrophy: Secondary | ICD-10-CM | POA: Diagnosis not present

## 2016-04-17 DIAGNOSIS — D485 Neoplasm of uncertain behavior of skin: Secondary | ICD-10-CM | POA: Diagnosis not present

## 2016-05-01 DIAGNOSIS — M722 Plantar fascial fibromatosis: Secondary | ICD-10-CM | POA: Diagnosis not present

## 2016-05-24 ENCOUNTER — Emergency Department (HOSPITAL_COMMUNITY)
Admission: EM | Admit: 2016-05-24 | Discharge: 2016-05-25 | Disposition: A | Discharge status: Home / Self Care | Payer: Medicare Other | Attending: Emergency Medicine | Admitting: Emergency Medicine

## 2016-05-24 ENCOUNTER — Emergency Department (HOSPITAL_COMMUNITY): Payer: Medicare Other

## 2016-05-24 DIAGNOSIS — I10 Essential (primary) hypertension: Secondary | ICD-10-CM | POA: Insufficient documentation

## 2016-05-24 DIAGNOSIS — Z96653 Presence of artificial knee joint, bilateral: Secondary | ICD-10-CM | POA: Insufficient documentation

## 2016-05-24 DIAGNOSIS — R51 Headache: Secondary | ICD-10-CM | POA: Insufficient documentation

## 2016-05-24 DIAGNOSIS — J0100 Acute maxillary sinusitis, unspecified: Secondary | ICD-10-CM

## 2016-05-24 DIAGNOSIS — R519 Headache, unspecified: Secondary | ICD-10-CM

## 2016-05-24 DIAGNOSIS — Z7982 Long term (current) use of aspirin: Secondary | ICD-10-CM | POA: Insufficient documentation

## 2016-05-24 LAB — COMPREHENSIVE METABOLIC PANEL
ALBUMIN: 4 g/dL (ref 3.5–5.0)
ALK PHOS: 64 U/L (ref 38–126)
ALT: 22 U/L (ref 14–54)
AST: 24 U/L (ref 15–41)
Anion gap: 10 (ref 5–15)
BILIRUBIN TOTAL: 0.8 mg/dL (ref 0.3–1.2)
BUN: 16 mg/dL (ref 6–20)
CALCIUM: 9.4 mg/dL (ref 8.9–10.3)
CO2: 24 mmol/L (ref 22–32)
Chloride: 105 mmol/L (ref 101–111)
Creatinine, Ser: 1.3 mg/dL — ABNORMAL HIGH (ref 0.44–1.00)
GFR calc Af Amer: 44 mL/min — ABNORMAL LOW (ref >60)
GFR calc non Af Amer: 38 mL/min — ABNORMAL LOW (ref >60)
GLUCOSE: 136 mg/dL — AB (ref 65–99)
POTASSIUM: 3.5 mmol/L (ref 3.5–5.1)
Sodium: 139 mmol/L (ref 135–145)
TOTAL PROTEIN: 7.3 g/dL (ref 6.5–8.1)

## 2016-05-24 LAB — CBC WITH DIFFERENTIAL/PLATELET
BASOS PCT: 0 %
Basophils Absolute: 0 10*3/uL (ref 0.0–0.1)
Eosinophils Absolute: 0 10*3/uL (ref 0.0–0.7)
Eosinophils Relative: 0 %
HEMATOCRIT: 39.1 % (ref 36.0–46.0)
HEMOGLOBIN: 12.8 g/dL (ref 12.0–15.0)
LYMPHS PCT: 14 %
Lymphs Abs: 1.5 10*3/uL (ref 0.7–4.0)
MCH: 28.6 pg (ref 26.0–34.0)
MCHC: 32.7 g/dL (ref 30.0–36.0)
MCV: 87.3 fL (ref 78.0–100.0)
Monocytes Absolute: 0.3 10*3/uL (ref 0.1–1.0)
Monocytes Relative: 3 %
Neutro Abs: 8.7 10*3/uL — ABNORMAL HIGH (ref 1.7–7.7)
Neutrophils Relative %: 83 %
Platelets: 200 10*3/uL (ref 150–400)
RBC: 4.48 MIL/uL (ref 3.87–5.11)
RDW: 14 % (ref 11.5–15.5)
WBC: 10.6 10*3/uL — AB (ref 4.0–10.5)

## 2016-05-24 LAB — I-STAT TROPONIN, ED: Troponin i, poc: 0 ng/mL (ref 0.00–0.08)

## 2016-05-24 MED ORDER — ACETAMINOPHEN 325 MG PO TABS
650.0000 mg | ORAL_TABLET | Freq: Once | ORAL | Status: AC
  Administered 2016-05-24: 650 mg via ORAL
  Filled 2016-05-24: qty 2

## 2016-05-24 MED ORDER — GI COCKTAIL ~~LOC~~
30.0000 mL | Freq: Once | ORAL | Status: AC
  Administered 2016-05-24: 30 mL via ORAL
  Filled 2016-05-24: qty 30

## 2016-05-24 NOTE — ED Notes (Signed)
Patient transported to CT 

## 2016-05-24 NOTE — ED Notes (Signed)
ED Provider at bedside. 

## 2016-05-24 NOTE — ED Triage Notes (Signed)
Pt states she has been eating a lot of junk food today and she started feel bad; pt c/o of phelm coming up for a while but has gotten worse today; pt states she has HA with feeling cold and can not get warm; pt states she has been eating things she should not be eating all day; pt a&ox 4 on arrival; Pt did not take meds today; Pt c/o sneezing lately but thinks it's due to allergies; Pt c/o HA of 9/10

## 2016-05-24 NOTE — ED Provider Notes (Signed)
[place] DEPT Provider Note   CSN: [id number] Arrival date & time: 05/24/16  2023     History   Chief Complaint Chief Complaint  Patient presents with  . Nasal Congestion  . Headache    HPI [name] is a 76 y.o. female.  Patient with history of diabetes, high cholesterol, hypertension -- presents with complaint of headache, double vision, indigestion. Patient admits to multiple dietary indiscretions over the past 24 hours. She reports eating yogurt, popcorn, salty potato chips, ham and other holiday foods today. This evening she developed a headache around her left eye with radiation to her left temporal area and in the back of her neck. She had some associated double vision which is now resolved. She reports not taking her blood pressure medications and these symptoms, which she has had before, typically mean her blood pressure is elevated. Daughter is at bedside. She states that the patient called her and complained of a headache, lightheadedness and nasal congestion. Patient denies signs of stroke including: facial droop, slurred speech, aphasia, weakness/numbness in extremities, trouble walking. She had no syncope or spinning sensation. She has had indigestion with belching but denies any chest pains or shortness of breath. She has felt nauseous but has had no vomiting, diaphoresis, diarrhea, or urinary symptoms. The onset of this condition was acute. The course is constant. Aggravating factors: none. Alleviating factors: none.         Past Medical History:  Diagnosis Date  . Arthritis   . Bilateral leg pain   . Degenerative disc disease   . Diabetes mellitus without complication   . Fuch's endothelial dystrophy   . Hyperlipidemia   . Hypertension   . Obesity   . Plantar fasciitis   . Vertigo     Patient Active Problem List   Diagnosis Date Noted  . Bilateral leg pain 12/19/2012  . OBESITY 12/10/2005  . INSOMNIA, CHRONIC 12/10/2005  . MIGRAINE HEADACHE  12/10/2005  . CATARACT NEC 12/10/2005  . TINNITUS 12/10/2005  . HYPERTENSION 12/10/2005  . SINUSITIS, CHRONIC 12/10/2005  . ALOPECIA 12/10/2005  . Osteoarthrosis, unspecified whether generalized or localized, unspecified site 12/10/2005  . HEMORRHOIDS, HX OF 12/10/2005    Past Surgical History:  Procedure Laterality Date  . CATARACT EXTRACTION Right   . CATARACT EXTRACTION Left   . ROTATOR CUFF REPAIR Right 07/22/2011  . TOTAL KNEE ARTHROPLASTY Right 04/15/2008  . TOTAL KNEE ARTHROPLASTY Left   . TOTAL VAGINAL HYSTERECTOMY      OB History    No data available       Home Medications    Prior to Admission medications   Medication Sig Start Date End Date Taking? Authorizing Provider  Aliskiren-Hydrochlorothiazide (TEKTURNA HCT) 300-12.5 MG TABS Take 1 tablet by mouth daily.    Historical Provider, MD  amLODipine (NORVASC) 10 MG tablet Take 10 mg by mouth daily.    Historical Provider, MD  amoxicillin (AMOXIL) 500 MG capsule Take 2,000 mg by mouth as needed (30 to 60 minutes before a dental appointment.).    Historical Provider, MD  aspirin EC 81 MG tablet Take 81 mg by mouth daily as needed for pain.    Historical Provider, MD  b complex vitamins tablet Take 1 tablet by mouth daily.    Historical Provider, MD  Cholecalciferol (VITAMIN D3) 2000 UNITS capsule Take 2,000 Units by mouth daily.    Historical Provider, MD  ciclesonide (OMNARIS) 50 MCG/ACT nasal spray Place 2 sprays into both nostrils as needed for allergies.  Historical Provider, MD  diclofenac sodium (VOLTAREN) 1 % GEL Apply 2 g topically as needed.    Historical Provider, MD  fluticasone (VERAMYST) 27.5 MCG/SPRAY nasal spray Place 2 sprays into the nose as needed for rhinitis.    Historical Provider, MD  ketotifen (RA ANTIHISTAMINE EYE DROPS) 0.025 % ophthalmic solution Place 1 drop into both eyes as needed.    Historical Provider, MD  nebivolol (BYSTOLIC) 5 MG tablet Take 5 mg by mouth daily.    Historical Provider,  MD  Polyethyl Glycol-Propyl Glycol (SYSTANE ULTRA) 0.4-0.3 % SOLN Apply 1 drop to eye as needed.    Historical Provider, MD  simvastatin (ZOCOR) 20 MG tablet Take 20 mg by mouth at bedtime.    Historical Provider, MD  terbinafine (LAMISIL) 250 MG tablet Take 250 mg by mouth daily.    Historical Provider, MD  trolamine salicylate (ASPERCREME) 10 % cream Apply 1 application topically as needed.    Historical Provider, MD    Family History Family History  Problem Relation Age of Onset  . Diabetes Mother   . Stroke Mother   . Heart attack Paternal Aunt     Massive MI  . Heart attack Paternal Aunt     Massive MI  . Diabetes Son   . Hypertension Son   . Diabetes Son   . Hypertension Son   . Hyperlipidemia Son   . Hypertension Daughter     Social History Social History  Substance Use Topics  . Smoking status: Never Smoker  . Smokeless tobacco: Never Used  . Alcohol use No     Allergies   Codeine; Diovan [valsartan]; Metoprolol tartrate; and Zocor [simvastatin]   Review of Systems Review of Systems  Constitutional: Negative for diaphoresis and fever.  HENT: Negative for congestion, dental problem, rhinorrhea, sinus pressure and sore throat.   Eyes: Negative for photophobia, discharge, redness and visual disturbance.  Respiratory: Negative for cough and shortness of breath.   Cardiovascular: Negative for chest pain, palpitations and leg swelling.  Gastrointestinal: Positive for nausea. Negative for abdominal pain, diarrhea and vomiting.  Genitourinary: Negative for dysuria.  Musculoskeletal: Positive for neck pain (chronic, posterior). Negative for back pain, gait problem, myalgias and neck stiffness.  Skin: Negative for rash.  Neurological: Positive for light-headedness and headaches. Negative for syncope, speech difficulty, weakness and numbness.  Psychiatric/Behavioral: Negative for confusion. The patient is not nervous/anxious.      Physical Exam Updated Vital  Signs BP (!) 156/71 (BP Location: Left Arm)   Pulse 64   Temp 97.7 F (36.5 C) (Oral)   Resp 18   SpO2 100%   Physical Exam  Constitutional: She is oriented to person, place, and time. She appears well-developed and well-nourished.  HENT:  Head: Normocephalic and atraumatic.  Right Ear: Tympanic membrane, external ear and ear canal normal.  Left Ear: Tympanic membrane, external ear and ear canal normal.  Nose: Right sinus exhibits no maxillary sinus tenderness and no frontal sinus tenderness. Left sinus exhibits maxillary sinus tenderness. Left sinus exhibits no frontal sinus tenderness.  Mouth/Throat: Uvula is midline, oropharynx is clear and moist and mucous membranes are normal. Mucous membranes are not dry.  Eyes: Conjunctivae, EOM and lids are normal. Pupils are equal, round, and reactive to light. Right eye exhibits no nystagmus. Left eye exhibits no nystagmus.  Neck: Trachea normal and normal range of motion. Neck supple. Normal carotid pulses and no JVD present. No muscular tenderness present. Carotid bruit is not present. No tracheal deviation present.  Cardiovascular: Normal rate, regular rhythm, S1 normal, S2 normal, normal heart sounds and intact distal pulses.  Exam reveals no decreased pulses.   No murmur heard. Pulmonary/Chest: Effort normal and breath sounds normal. No respiratory distress. She has no wheezes. She exhibits no tenderness.  Abdominal: Soft. Normal aorta and bowel sounds are normal. There is no tenderness. There is no rebound and no guarding.  Musculoskeletal: Normal range of motion.       Cervical back: She exhibits normal range of motion, no tenderness and no bony tenderness.  Neurological: She is alert and oriented to person, place, and time. She has normal strength and normal reflexes. No cranial nerve deficit or sensory deficit. She displays a negative Romberg sign. Coordination and gait normal. GCS eye subscore is 4. GCS verbal subscore is 5. GCS motor  subscore is 6.  Skin: Skin is warm and dry. She is not diaphoretic. No cyanosis. No pallor.  Psychiatric: She has a normal mood and affect.  Nursing note and vitals reviewed.    ED Treatments / Results  Labs (all labs ordered are listed, but only abnormal results are displayed) Labs Reviewed  CBC WITH DIFFERENTIAL/PLATELET - Abnormal; Notable for the following:       Result Value   WBC 10.6 (*)    Neutro Abs 8.7 (*)    All other components within normal limits  COMPREHENSIVE METABOLIC PANEL - Abnormal; Notable for the following:    Glucose, Bld 136 (*)    Creatinine, Ser 1.30 (*)    GFR calc non Af Amer 38 (*)    GFR calc Af Amer 44 (*)    All other components within normal limits  URINALYSIS, ROUTINE W REFLEX MICROSCOPIC  I-STAT TROPOININ, ED    EKG  EKG Interpretation  Date/Time:  Sunday May 24 2016 21:36:23 EDT Ventricular Rate:  58 PR Interval:    QRS Duration: 103 QT Interval:  435 QTC Calculation: 428 R Axis:   24 Text Interpretation:  Sinus rhythm When compared with ECG of 12/21/2004 No significant change was found Confirmed by [facility]  MD, Nunzio Cory [phone]) on 05/24/2016 10:47:04 PM       Radiology Ct Head Wo Contrast  Result Date: 05/24/2016 CLINICAL DATA:  Headache. History of vertigo, hypertension, diabetes, hyperlipidemia, tinnitus, migraines. EXAM: CT HEAD WITHOUT CONTRAST TECHNIQUE: Contiguous axial images were obtained from the base of the skull through the vertex without intravenous contrast. COMPARISON:  CT HEAD December 21, 2004 FINDINGS: BRAIN: No intraparenchymal hemorrhage, mass effect nor midline shift. The ventricles and sulci are normal for age. Patchy supratentorial white matter hypodensities less than expected for patient's age, though non-specific are most compatible with chronic small vessel ischemic disease. No acute large vascular territory infarcts. No abnormal extra-axial fluid collections. Basal cisterns are patent. VASCULAR: Moderate calcific  atherosclerosis of the carotid siphons. SKULL: No skull fracture. No significant scalp soft tissue swelling. SINUSES/ORBITS: Mild paranasal sinus mucosal thickening without air fluid fluid levels. Mastoid air cells are well aerated. Dehiscence RIGHT jugular bulb. The included ocular globes and orbital contents are non-suspicious. Status post bilateral ocular lens implants. OTHER: None. IMPRESSION: Normal CT HEAD for age. Electronically Signed   By: Elon Alas M.D.   On: 05/24/2016 22:12    Procedures Procedures (including critical care time)  Medications Ordered in ED Medications  acetaminophen (TYLENOL) tablet 650 mg (650 mg Oral Given 05/24/16 2259)  gi cocktail (Maalox,Lidocaine,Donnatal) (30 mLs Oral Given 05/24/16 2300)     Initial Impression / Assessment and Plan /  ED Course  I have reviewed the triage vital signs and the nursing notes.  Pertinent labs & imaging results that were available during my care of the patient were reviewed by me and considered in my medical decision making (see chart for details).     Patient seen and examined. She has multiple complaints and risk factors. Her main complaint seems to be her left-sided headache. Will obtain head CT. Given history of diabetes and recent poor diet, will check lab work. Low suspicion for ACS. She has indigestion with active belching in the room. Will obtain EKG and troponin.   Vital signs reviewed and are as follows: BP (!) 156/71 (BP Location: Left Arm)   Pulse 64   Temp 97.7 F (36.5 C) (Oral)   Resp 18   SpO2 100%   10:57 PM Pt reviewed with Dr. Thurnell Garbe.   Pt updated. She is feeling a bit better. Requesting medication for belching. She has been up to the restroom -- awaiting UA.   After that results, will d/c to home with medication for acute sinusitis. Encouraged patient to f/u with PCP in the upcoming week to ensure symptom improvement and resolution. Daughter is in agreement with this plan.   Patient  counseled to return if they have weakness in their arms or legs, slurred speech, trouble walking or talking, confusion, trouble with their balance, or if they have any other concerns. Patient verbalizes understanding and agrees with plan.   12:25 AM UA neg. Pt and daughter informed. Augmentin rx for sinusitis. Encouraged OTC meds for belching.   Final Clinical Impressions(s) / ED Diagnoses   Final diagnoses:  Acute non-recurrent maxillary sinusitis  Acute nonintractable headache, unspecified headache type   Patient with a plethora of vague complaints here tonight. She does not have any focal neurological deficits on exam. Patient is ambulatory. She had a short-lived episode of diplopia without other symptoms, low suspicion for TIA. She has had double vision in the past with headaches. CT is negative. Headache is improving. Exam is stable while in the emergency department. CT shows sinusitis in patient does have some left-sided facial tenderness. L eye appears normal without redness or drainage. There is no temporal tenderness and no clinical signs of glaucoma. Low suspicion for this given constellation of symptoms. Patient has indigestion after dietary indiscretions today. EKG and troponin were checked and found to be negative. Patient has no persistent chest pain or pressure. She is actively belching during exam. Remainder of workup is reassuring. Patient has appropriate PCP follow-up. Daughter at bedside will ensure that she follows up for returns if her symptoms change or worsen.   New Prescriptions New Prescriptions   AMOXICILLIN-CLAVULANATE (AUGMENTIN) 875-125 MG TABLET    Take 1 tablet by mouth every 12 (twelve) hours.     Carlisle Cater, PA-C 05/25/16 [place], DO 05/26/16 2010

## 2016-05-25 LAB — URINALYSIS, ROUTINE W REFLEX MICROSCOPIC
BILIRUBIN URINE: NEGATIVE
GLUCOSE, UA: NEGATIVE mg/dL
Hgb urine dipstick: NEGATIVE
KETONES UR: NEGATIVE mg/dL
LEUKOCYTES UA: NEGATIVE
NITRITE: NEGATIVE
PH: 5 (ref 5.0–8.0)
PROTEIN: NEGATIVE mg/dL
Specific Gravity, Urine: 1.014 (ref 1.005–1.030)

## 2016-05-25 MED ORDER — AMOXICILLIN-POT CLAVULANATE 875-125 MG PO TABS: 1.0000 | ORAL_TABLET | Freq: Two times a day (BID) | ORAL | 0 refills | Status: DC

## 2016-05-25 NOTE — Discharge Instructions (Signed)
Please read and follow all provided instructions.  Your diagnoses today include:  1. Acute non-recurrent maxillary sinusitis   2. Acute nonintractable headache, unspecified headache type     Tests performed today include:  CT of your head which was normal and did not show any serious cause of your headache, it did show a sinus infection  Blood counts and electrolytes  Urine test - no infection  Vital signs. See below for your results today.   Medications:   Augmentin - antibiotic  You have been prescribed an antibiotic medicine: take the entire course of medicine even if you are feeling better. Stopping early can cause the antibiotic not to work.  Take any prescribed medications only as directed.  Additional information:  Follow any educational materials contained in this packet.  You are having a headache. No specific cause was found today for your headache. It may have been a migraine or other cause of headache. Stress, anxiety, fatigue, and depression are common triggers for headaches.   Your headache today does not appear to be life-threatening or require hospitalization, but often the exact cause of headaches is not determined in the emergency department. Therefore, follow-up with your doctor is very important to find out what may have caused your headache and whether or not you need any further diagnostic testing or treatment.   Sometimes headaches can appear benign (not harmful), but then more serious symptoms can develop which should prompt an immediate re-evaluation by your doctor or the emergency department.  BE VERY CAREFUL not to take multiple medicines containing Tylenol (also called acetaminophen). Doing so can lead to an overdose which can damage your liver and cause liver failure and possibly death.   Follow-up instructions: Please follow-up with your primary care provider in the next 3 days for further evaluation of your symptoms.   Return instructions:    Please return to the Emergency Department if you experience worsening symptoms.  Return if the medications do not resolve your headache, if it recurs, or if you have multiple episodes of vomiting or cannot keep down fluids.  Return if you have a change from the usual headache.  RETURN IMMEDIATELY IF you:  Develop a sudden, severe headache  Develop confusion or become poorly responsive or faint  Develop a fever above 100.24F or problem breathing  Have a change in speech, vision, swallowing, or understanding  Develop new weakness, numbness, tingling, incoordination in your arms or legs  Have a seizure  Please return if you have any other emergent concerns.  Additional Information:  Your vital signs today were: BP (!) 156/71 (BP Location: Left Arm)    Pulse 64    Temp 97.7 F (36.5 C) (Oral)    Resp 18    SpO2 100%  If your blood pressure (BP) was elevated above 135/85 this visit, please have this repeated by your doctor within one month. --------------

## 2016-06-26 DIAGNOSIS — I739 Peripheral vascular disease, unspecified: Secondary | ICD-10-CM | POA: Diagnosis not present

## 2016-06-26 DIAGNOSIS — L84 Corns and callosities: Secondary | ICD-10-CM | POA: Diagnosis not present

## 2016-06-26 DIAGNOSIS — E1151 Type 2 diabetes mellitus with diabetic peripheral angiopathy without gangrene: Secondary | ICD-10-CM | POA: Diagnosis not present

## 2016-06-26 DIAGNOSIS — L603 Nail dystrophy: Secondary | ICD-10-CM | POA: Diagnosis not present

## 2016-09-16 DIAGNOSIS — R8290 Unspecified abnormal findings in urine: Secondary | ICD-10-CM | POA: Diagnosis not present

## 2016-09-16 DIAGNOSIS — R197 Diarrhea, unspecified: Secondary | ICD-10-CM | POA: Diagnosis not present

## 2016-09-16 DIAGNOSIS — R252 Cramp and spasm: Secondary | ICD-10-CM | POA: Diagnosis not present

## 2016-09-16 DIAGNOSIS — K219 Gastro-esophageal reflux disease without esophagitis: Secondary | ICD-10-CM | POA: Diagnosis not present

## 2016-09-25 DIAGNOSIS — I739 Peripheral vascular disease, unspecified: Secondary | ICD-10-CM | POA: Diagnosis not present

## 2016-09-25 DIAGNOSIS — L84 Corns and callosities: Secondary | ICD-10-CM | POA: Diagnosis not present

## 2016-09-25 DIAGNOSIS — E1151 Type 2 diabetes mellitus with diabetic peripheral angiopathy without gangrene: Secondary | ICD-10-CM | POA: Diagnosis not present

## 2016-09-25 DIAGNOSIS — L603 Nail dystrophy: Secondary | ICD-10-CM | POA: Diagnosis not present

## 2016-11-10 DIAGNOSIS — N183 Chronic kidney disease, stage 3 (moderate): Secondary | ICD-10-CM | POA: Diagnosis not present

## 2016-11-10 DIAGNOSIS — E1122 Type 2 diabetes mellitus with diabetic chronic kidney disease: Secondary | ICD-10-CM | POA: Diagnosis not present

## 2016-11-10 DIAGNOSIS — I10 Essential (primary) hypertension: Secondary | ICD-10-CM | POA: Diagnosis not present

## 2016-11-10 DIAGNOSIS — M179 Osteoarthritis of knee, unspecified: Secondary | ICD-10-CM | POA: Diagnosis not present

## 2016-11-10 DIAGNOSIS — Z79899 Other long term (current) drug therapy: Secondary | ICD-10-CM | POA: Diagnosis not present

## 2016-11-10 DIAGNOSIS — Z0000 Encounter for general adult medical examination without abnormal findings: Secondary | ICD-10-CM | POA: Diagnosis not present

## 2016-11-10 DIAGNOSIS — E782 Mixed hyperlipidemia: Secondary | ICD-10-CM | POA: Diagnosis not present

## 2016-11-10 DIAGNOSIS — M797 Fibromyalgia: Secondary | ICD-10-CM | POA: Diagnosis not present

## 2016-11-10 DIAGNOSIS — M5416 Radiculopathy, lumbar region: Secondary | ICD-10-CM | POA: Diagnosis not present

## 2016-11-10 DIAGNOSIS — Z23 Encounter for immunization: Secondary | ICD-10-CM | POA: Diagnosis not present

## 2016-11-10 DIAGNOSIS — M5136 Other intervertebral disc degeneration, lumbar region: Secondary | ICD-10-CM | POA: Diagnosis not present

## 2016-11-10 DIAGNOSIS — E559 Vitamin D deficiency, unspecified: Secondary | ICD-10-CM | POA: Diagnosis not present

## 2016-11-19 DIAGNOSIS — M6281 Muscle weakness (generalized): Secondary | ICD-10-CM | POA: Diagnosis not present

## 2016-11-19 DIAGNOSIS — M5136 Other intervertebral disc degeneration, lumbar region: Secondary | ICD-10-CM | POA: Diagnosis not present

## 2016-11-24 DIAGNOSIS — G8929 Other chronic pain: Secondary | ICD-10-CM | POA: Diagnosis not present

## 2016-11-24 DIAGNOSIS — M545 Low back pain: Secondary | ICD-10-CM | POA: Diagnosis not present

## 2016-11-26 DIAGNOSIS — Z471 Aftercare following joint replacement surgery: Secondary | ICD-10-CM | POA: Diagnosis not present

## 2016-11-26 DIAGNOSIS — M170 Bilateral primary osteoarthritis of knee: Secondary | ICD-10-CM | POA: Diagnosis not present

## 2016-11-26 DIAGNOSIS — Z96652 Presence of left artificial knee joint: Secondary | ICD-10-CM | POA: Diagnosis not present

## 2016-11-26 DIAGNOSIS — Z96653 Presence of artificial knee joint, bilateral: Secondary | ICD-10-CM | POA: Diagnosis not present

## 2016-11-26 DIAGNOSIS — Z96651 Presence of right artificial knee joint: Secondary | ICD-10-CM | POA: Diagnosis not present

## 2016-11-27 DIAGNOSIS — G8929 Other chronic pain: Secondary | ICD-10-CM | POA: Diagnosis not present

## 2016-11-27 DIAGNOSIS — M545 Low back pain: Secondary | ICD-10-CM | POA: Diagnosis not present

## 2016-11-30 DIAGNOSIS — M545 Low back pain: Secondary | ICD-10-CM | POA: Diagnosis not present

## 2016-11-30 DIAGNOSIS — G8929 Other chronic pain: Secondary | ICD-10-CM | POA: Diagnosis not present

## 2016-12-01 DIAGNOSIS — Z77122 Contact with and (suspected) exposure to noise: Secondary | ICD-10-CM | POA: Diagnosis not present

## 2016-12-01 DIAGNOSIS — H903 Sensorineural hearing loss, bilateral: Secondary | ICD-10-CM | POA: Diagnosis not present

## 2016-12-01 DIAGNOSIS — H9313 Tinnitus, bilateral: Secondary | ICD-10-CM | POA: Diagnosis not present

## 2016-12-04 DIAGNOSIS — M545 Low back pain: Secondary | ICD-10-CM | POA: Diagnosis not present

## 2016-12-04 DIAGNOSIS — G8929 Other chronic pain: Secondary | ICD-10-CM | POA: Diagnosis not present

## 2016-12-08 DIAGNOSIS — M797 Fibromyalgia: Secondary | ICD-10-CM | POA: Diagnosis not present

## 2016-12-09 DIAGNOSIS — G8929 Other chronic pain: Secondary | ICD-10-CM | POA: Diagnosis not present

## 2016-12-09 DIAGNOSIS — M545 Low back pain: Secondary | ICD-10-CM | POA: Diagnosis not present

## 2016-12-11 DIAGNOSIS — M545 Low back pain: Secondary | ICD-10-CM | POA: Diagnosis not present

## 2016-12-11 DIAGNOSIS — G8929 Other chronic pain: Secondary | ICD-10-CM | POA: Diagnosis not present

## 2016-12-15 DIAGNOSIS — G8929 Other chronic pain: Secondary | ICD-10-CM | POA: Diagnosis not present

## 2016-12-15 DIAGNOSIS — M545 Low back pain: Secondary | ICD-10-CM | POA: Diagnosis not present

## 2016-12-17 DIAGNOSIS — M545 Low back pain: Secondary | ICD-10-CM | POA: Diagnosis not present

## 2016-12-17 DIAGNOSIS — G8929 Other chronic pain: Secondary | ICD-10-CM | POA: Diagnosis not present

## 2016-12-21 DIAGNOSIS — M545 Low back pain: Secondary | ICD-10-CM | POA: Diagnosis not present

## 2016-12-21 DIAGNOSIS — G8929 Other chronic pain: Secondary | ICD-10-CM | POA: Diagnosis not present

## 2016-12-23 DIAGNOSIS — L603 Nail dystrophy: Secondary | ICD-10-CM | POA: Diagnosis not present

## 2016-12-23 DIAGNOSIS — I739 Peripheral vascular disease, unspecified: Secondary | ICD-10-CM | POA: Diagnosis not present

## 2016-12-23 DIAGNOSIS — E1151 Type 2 diabetes mellitus with diabetic peripheral angiopathy without gangrene: Secondary | ICD-10-CM | POA: Diagnosis not present

## 2016-12-23 DIAGNOSIS — L84 Corns and callosities: Secondary | ICD-10-CM | POA: Diagnosis not present

## 2016-12-24 DIAGNOSIS — G8929 Other chronic pain: Secondary | ICD-10-CM | POA: Diagnosis not present

## 2016-12-24 DIAGNOSIS — M545 Low back pain: Secondary | ICD-10-CM | POA: Diagnosis not present

## 2016-12-29 DIAGNOSIS — M545 Low back pain: Secondary | ICD-10-CM | POA: Diagnosis not present

## 2016-12-29 DIAGNOSIS — G8929 Other chronic pain: Secondary | ICD-10-CM | POA: Diagnosis not present

## 2016-12-30 DIAGNOSIS — M5136 Other intervertebral disc degeneration, lumbar region: Secondary | ICD-10-CM | POA: Diagnosis not present

## 2017-01-29 ENCOUNTER — Other Ambulatory Visit: Payer: Self-pay | Admitting: Family Medicine

## 2017-01-29 DIAGNOSIS — Z1231 Encounter for screening mammogram for malignant neoplasm of breast: Secondary | ICD-10-CM

## 2017-02-24 DIAGNOSIS — Z961 Presence of intraocular lens: Secondary | ICD-10-CM | POA: Diagnosis not present

## 2017-02-24 DIAGNOSIS — H1851 Endothelial corneal dystrophy: Secondary | ICD-10-CM | POA: Diagnosis not present

## 2017-02-24 DIAGNOSIS — H02886 Meibomian gland dysfunction of left eye, unspecified eyelid: Secondary | ICD-10-CM | POA: Diagnosis not present

## 2017-02-24 DIAGNOSIS — H02883 Meibomian gland dysfunction of right eye, unspecified eyelid: Secondary | ICD-10-CM | POA: Diagnosis not present

## 2017-02-25 ENCOUNTER — Encounter: Payer: Self-pay | Admitting: Neurology

## 2017-02-25 ENCOUNTER — Ambulatory Visit (INDEPENDENT_AMBULATORY_CARE_PROVIDER_SITE_OTHER): Payer: Medicare Other | Admitting: Neurology

## 2017-02-25 ENCOUNTER — Other Ambulatory Visit: Payer: Self-pay

## 2017-02-25 VITALS — BP 120/66 | HR 68 | Resp 18 | Ht 64.0 in | Wt 232.5 lb

## 2017-02-25 DIAGNOSIS — M79605 Pain in left leg: Secondary | ICD-10-CM | POA: Diagnosis not present

## 2017-02-25 DIAGNOSIS — M79642 Pain in left hand: Secondary | ICD-10-CM

## 2017-02-25 DIAGNOSIS — M25552 Pain in left hip: Secondary | ICD-10-CM

## 2017-02-25 DIAGNOSIS — M79641 Pain in right hand: Secondary | ICD-10-CM

## 2017-02-25 DIAGNOSIS — M25559 Pain in unspecified hip: Secondary | ICD-10-CM | POA: Insufficient documentation

## 2017-02-25 DIAGNOSIS — M79643 Pain in unspecified hand: Secondary | ICD-10-CM | POA: Insufficient documentation

## 2017-02-25 DIAGNOSIS — M5416 Radiculopathy, lumbar region: Secondary | ICD-10-CM | POA: Diagnosis not present

## 2017-02-25 DIAGNOSIS — M79604 Pain in right leg: Secondary | ICD-10-CM | POA: Diagnosis not present

## 2017-02-25 DIAGNOSIS — R208 Other disturbances of skin sensation: Secondary | ICD-10-CM | POA: Diagnosis not present

## 2017-02-25 NOTE — Progress Notes (Signed)
[facility]  PATIENT: [name] DOB: 07-12-1940  REFERRING DOCTOR OR PCP:  Suella Broad, MD, PCP is Alene Mires, MD SOURCE: Patient, notes from Dr. Nelva Bush  _________________________________   HISTORICAL  CHIEF COMPLAINT:  Chief Complaint  Patient presents with  . Back Pain    Lower back pian onset yrs. ago without known injury.  Bilat leg weakness, left worse than right, unsure how long.  Seen by Antionette Char for same; sts. PT helps some. Hx. bilat total knee replacements/fim    HISTORY OF PRESENT ILLNESS:  I had the pleasure of seeing your patient, [name], at [facility] for a neurologic consultation regarding her back pain, leg weakness and foot numbness.    [name] is a poor historian.  [name] is a 77 yo who first started to have lower back pain about 4 years ago and numbness in her feet for about 4 years, as well.       Currently, [name] has pain in the right > left buttock and hip but other times, pain has been worse on her left.   The numbness is usually noted more in her left foot than the right.   Her back pain is worse when [name] turns over in bed.    Of note, lumbar x-ray 08/02/2014 showed mild anterolisthesis at L4L5 associated with facet hypertrophy and reduced disc height.   [name] has not had an MRI scan of her lumbar spine..     [name] also reports arthritis in her hands and left > right shoulders.   [name] reports arthritic pain is worse when the weather changes (rain).    [name] feels her hands are mildly weaker and her grip is poor.    [name] has trouble getting up from the floor.      [name] denies any change in bladder function. No significant weakness in the legs though [name] feels they sometimes feel heavy.  [name] had right TKR followed by left TKR about 9 years ago (Dr. Maureen Ralphs).        REVIEW OF SYSTEMS: Constitutional: No fevers, chills, sweats, or change in appetite Eyes: No visual changes, double vision, eye pain.  [name] has had  cataract operations bilaterally Ear, nose and throat: [name] reports some asthma and frequent sinusitis. Cardiovascular: No chest pain, palpitations Respiratory: No shortness of breath at rest or with exertion.   No wheezes GastrointestinaI: No nausea, vomiting, diarrhea, abdominal pain, fecal incontinence Genitourinary: No dysuria, urinary retention or frequency.  No nocturia. Musculoskeletal: as above Integumentary: No rash, pruritus, skin lesions Neurological: as above Psychiatric: No depression at this time.  No anxiety Endocrine: No palpitations, diaphoresis, change in appetite, change in weigh or increased thirst Hematologic/Lymphatic: No anemia, purpura, petechiae. Allergic/Immunologic: No itchy/runny eyes, nasal congestion, recent allergic reactions, rashes  ALLERGIES: Allergies  Allergen Reactions  . Codeine     Shakes   . Diovan [Valsartan]     Shaking  . Metoprolol Tartrate     REACTION: actually allergy to beta blocker  . Zocor [Simvastatin]     Alopecia    HOME MEDICATIONS:  Current Outpatient Medications:  .  Aliskiren-Hydrochlorothiazide (TEKTURNA HCT) 300-12.5 MG TABS, Take 1 tablet by mouth daily., Disp: , Rfl:  .  amLODipine (NORVASC) 10 MG tablet, Take 10 mg by mouth daily., Disp: , Rfl:  .  Chlorphen-PE-Acetaminophen (NOREL AD) 4-10-325 MG TABS, Take by mouth., Disp: , Rfl:  .  ciclesonide (OMNARIS) 50 MCG/ACT nasal spray, Place 2 sprays into both nostrils  as needed for allergies., Disp: , Rfl:  .  DULoxetine (CYMBALTA) 30 MG capsule, TK 1 C PO QD, Disp: , Rfl:  .  fluticasone (VERAMYST) 27.5 MCG/SPRAY nasal spray, Place 2 sprays into the nose as needed for rhinitis., Disp: , Rfl:  .  ketotifen (RA ANTIHISTAMINE EYE DROPS) 0.025 % ophthalmic solution, Place 1 drop into both eyes daily as needed (allergies). , Disp: , Rfl:  .  Menthol (BIOFREEZE) 10 % AERO, Apply topically., Disp: , Rfl:  .  nebivolol (BYSTOLIC) 5 MG tablet, Take 6.25 mg by mouth daily. ,  Disp: , Rfl:  .  omeprazole (PRILOSEC) 20 MG capsule, Take 20 mg by mouth daily., Disp: , Rfl:  .  Polyethyl Glycol-Propyl Glycol (SYSTANE ULTRA) 0.4-0.3 % SOLN, Apply 1 drop to eye daily as needed (dryness). , Disp: , Rfl:  .  rosuvastatin (CRESTOR) 20 MG tablet, Take 20 mg by mouth daily., Disp: , Rfl:  .  trolamine salicylate (ASPERCREME) 10 % cream, Apply 1 application topically 2 (two) times daily as needed for muscle pain. , Disp: , Rfl:  .  acetaminophen (TYLENOL) 325 MG tablet, Take by mouth., Disp: , Rfl:  .  amoxicillin (AMOXIL) 500 MG capsule, Take 2,000 mg by mouth See admin instructions. 30 to 60 minutes before a dental appointment., Disp: , Rfl:  .  amoxicillin-clavulanate (AUGMENTIN) 875-125 MG tablet, Take 1 tablet by mouth every 12 (twelve) hours., Disp: 14 tablet, Rfl: 0 .  aspirin EC 81 MG tablet, Take 81 mg by mouth daily as needed for pain., Disp: , Rfl:  .  b complex vitamins tablet, Take 1 tablet by mouth daily., Disp: , Rfl:  .  Cholecalciferol (VITAMIN D3) 2000 UNITS capsule, Take 2,000 Units by mouth daily., Disp: , Rfl:  .  diclofenac sodium (VOLTAREN) 1 % GEL, Apply 2 g topically daily as needed (pain). , Disp: , Rfl:   PAST MEDICAL HISTORY: Past Medical History:  Diagnosis Date  . Arthritis   . Bilateral leg pain   . Degenerative disc disease   . Diabetes mellitus without complication ([place])   . Fuch's endothelial dystrophy   . Hyperlipidemia   . Hypertension   . Obesity   . Plantar fasciitis   . Vertigo     PAST SURGICAL HISTORY: Past Surgical History:  Procedure Laterality Date  . CATARACT EXTRACTION Right   . CATARACT EXTRACTION Left   . ROTATOR CUFF REPAIR Right 07/22/2011  . TOTAL KNEE ARTHROPLASTY Right 04/15/2008  . TOTAL KNEE ARTHROPLASTY Left   . TOTAL VAGINAL HYSTERECTOMY      FAMILY HISTORY: Family History  Problem Relation Age of Onset  . Diabetes Mother   . Stroke Mother   . Heart attack Paternal Aunt        Massive MI  . Heart  attack Paternal Aunt        Massive MI  . Diabetes Son   . Hypertension Son   . Diabetes Son   . Hypertension Son   . Hyperlipidemia Son   . Hypertension Daughter     SOCIAL HISTORY:  Social History   Socioeconomic History  . Marital status: Single    Spouse name: Not on file  . Number of children: Not on file  . Years of education: Not on file  . Highest education level: Not on file  Social Needs  . Financial resource strain: Not on file  . Food insecurity - worry: Not on file  . Food insecurity - inability: Not  on file  . Transportation needs - medical: Not on file  . Transportation needs - non-medical: Not on file  Occupational History  . Not on file  Tobacco Use  . Smoking status: Never Smoker  . Smokeless tobacco: Never Used  Substance and Sexual Activity  . Alcohol use: No  . Drug use: Not on file  . Sexual activity: Not on file  Other Topics Concern  . Not on file  Social History Narrative  . Not on file     PHYSICAL EXAM  Vitals:   02/25/17 0921  BP: 120/66  Pulse: 68  Resp: 18  Weight: 232 lb 8 oz (105.5 kg)  Height: 5' 4" (1.626 m)    Body mass index is 39.91 kg/m.   General: The patient is well-developed and well-nourished and in no acute distress  Eyes:  Funduscopic exam shows normal optic discs and retinal vessels.  Neck: The neck is supple, no carotid bruits are noted.  The neck is nontender.  Cardiovascular: The heart has a regular rate and rhythm with a normal S1 and S2. There were no murmurs, gallops or rubs. Lungs are clear to auscultation.  Skin: Extremities are without significant edema.  Musculoskeletal:  Back is nontender  Neurologic Exam  Mental status: The patient is alert and oriented x 3 at the time of the examination. The patient has apparent normal recent and remote memory, with an apparently normal attention span and concentration ability.   Speech is normal.  Cranial nerves: Extraocular movements are full. Pupils  are equal, round, and reactive to light and accomodation.  Visual fields are full.  Facial symmetry is present. There is good facial sensation to soft touch bilaterally.Facial strength is normal.  Trapezius and sternocleidomastoid strength is normal. No dysarthria is noted.  The tongue is midline, and the patient has symmetric elevation of the soft palate. No obvious hearing deficits are noted.  Motor:  Muscle bulk is normal.   Tone is normal. Strength is  5 / 5 in all 4 extremities.   Sensory: Sensory testing is intact to pinprick, soft touch and vibration sensation in all 4 extremities.  Coordination: Cerebellar testing reveals good finger-nose-finger and heel-to-shin bilaterally.  Gait and station: Station is normal.   Gait is normal. Tandem gait is normal. Romberg is negative.   Reflexes: Deep tendon reflexes are symmetric and normal bilaterally.   Plantar responses are flexor.    DIAGNOSTIC DATA (LABS, IMAGING, TESTING) - I reviewed patient records, labs, notes, testing and imaging myself where available.  Lab Results  Component Value Date   WBC 10.6 (H) 05/24/2016   HGB 12.8 05/24/2016   HCT 39.1 05/24/2016   MCV 87.3 05/24/2016   PLT 200 05/24/2016      Component Value Date/Time   NA 139 05/24/2016 2132   K 3.5 05/24/2016 2132   CL 105 05/24/2016 2132   CO2 24 05/24/2016 2132   GLUCOSE 136 (H) 05/24/2016 2132   BUN 16 05/24/2016 2132   CREATININE 1.30 (H) 05/24/2016 2132   CALCIUM 9.4 05/24/2016 2132   PROT 7.3 05/24/2016 2132   ALBUMIN 4.0 05/24/2016 2132   AST 24 05/24/2016 2132   ALT 22 05/24/2016 2132   ALKPHOS 64 05/24/2016 2132   BILITOT 0.8 05/24/2016 2132   GFRNONAA 38 (L) 05/24/2016 2132   GFRAA 44 (L) 05/24/2016 2132    Lab Results  Component Value Date   TSH 2.965 11/19/2005       ASSESSMENT AND PLAN  Bilateral  leg pain - Plan: MR LUMBAR SPINE WO CONTRAST  Left lumbar radiculopathy - Plan: MR LUMBAR SPINE WO CONTRAST, NCV with  EMG(electromyography)  Pain of left hip joint - Plan: Rheumatoid factor, Sedimentation rate, MR LUMBAR SPINE WO CONTRAST  Dysesthesia - Plan: Sedimentation rate, NCV with EMG(electromyography)  Pain in both hands - Plan: Rheumatoid factor, Sedimentation rate, NCV with EMG(electromyography)    In summary, [name] is a 77 year old woman who reports multiple different types of pain. Specifically [name] has left greater than right hip and buttock pain and left greater than right foot pain. On examination, [name] was tender over the trochanteric bursa on the left also had mild tenderness over the piriformis muscles.    Probably her pain is related to these findings.    Additionally, on examination [name] appeared to have a mild left L5 radiculopathy. This is likely related to her degenerative changes noted on x-ray at L4-L5 in the past (anterolisthesis and facet hypertrophy with foraminal narrowing) to better evaluate the pain [name] is experiencing in her legs we will be checking an MRI of the lumbar spine and a nerve conduction study and EMG. The findings of the lumbar spine, we may need to consider referral for epidural steroid injection or other therapy.     [name] will continue when necessary Tylenol for pain. I advised her to take the duloxetine on a regular basis. If [name] tolerates 30 mg this can be increased to 60 mg in the future.    Additionally, [name] has bilateral hand pain with tenderness over the MCP and CMP joints more than other joints. We'll check rheumatoid factor and ESR.    I will see her when [name] returns for the EMG/NCV. [name] should call us or orthopedics if [name] has an increase in pain.   Thank you for asking me to see [name]. Please let know if I can be of further assistance with her or other patients in the future.  Richard A. Felecia Shelling, MD, [facility] 03/30/3662, 4:03 AM Certified in Neurology, Clinical Neurophysiology, Sleep Medicine, Pain Medicine and Neuroimaging  [facility] [address] [place], [place] [address] [phone]

## 2017-02-26 LAB — RHEUMATOID FACTOR

## 2017-02-26 LAB — SEDIMENTATION RATE: SED RATE: 26 mm/h (ref 0–40)

## 2017-03-10 ENCOUNTER — Ambulatory Visit
Admission: RE | Admit: 2017-03-10 | Discharge: 2017-03-10 | Disposition: A | Discharge status: Home / Self Care | Payer: Medicare Other | Source: Ambulatory Visit | Attending: Neurology | Admitting: Neurology

## 2017-03-10 DIAGNOSIS — M79604 Pain in right leg: Secondary | ICD-10-CM

## 2017-03-10 DIAGNOSIS — M5416 Radiculopathy, lumbar region: Secondary | ICD-10-CM | POA: Diagnosis not present

## 2017-03-10 DIAGNOSIS — M25552 Pain in left hip: Secondary | ICD-10-CM

## 2017-03-10 DIAGNOSIS — M79605 Pain in left leg: Principal | ICD-10-CM

## 2017-03-12 ENCOUNTER — Ambulatory Visit
Admission: RE | Admit: 2017-03-12 | Discharge: 2017-03-12 | Disposition: A | Discharge status: Home / Self Care | Payer: Medicare Other | Source: Ambulatory Visit | Attending: Family Medicine | Admitting: Family Medicine

## 2017-03-12 DIAGNOSIS — Z1231 Encounter for screening mammogram for malignant neoplasm of breast: Secondary | ICD-10-CM

## 2017-03-15 ENCOUNTER — Telehealth: Payer: Self-pay | Admitting: *Deleted

## 2017-03-15 NOTE — Telephone Encounter (Signed)
I have spoken with Ms. Roger this afternoon and reviewed below MRI results.  She verbalized understanding of same, would like to discuss further with RAS, when he dtr. is with her for EMG/NCV/fim

## 2017-03-15 NOTE — Telephone Encounter (Signed)
-----   Message from Britt Bottom, MD sent at 03/12/2017  3:35 PM EST ----- Please let her know that the MRI does show a lot of degenerative changes at L4-L5 that could cause back pain and there could also be some pressure on the left L5 nerve root. If she is not any better we can send for an L5 ESI.

## 2017-03-23 ENCOUNTER — Ambulatory Visit (INDEPENDENT_AMBULATORY_CARE_PROVIDER_SITE_OTHER): Payer: Medicare Other | Admitting: Neurology

## 2017-03-23 DIAGNOSIS — R208 Other disturbances of skin sensation: Secondary | ICD-10-CM

## 2017-03-23 DIAGNOSIS — M79642 Pain in left hand: Secondary | ICD-10-CM

## 2017-03-23 DIAGNOSIS — M5416 Radiculopathy, lumbar region: Secondary | ICD-10-CM | POA: Diagnosis not present

## 2017-03-23 DIAGNOSIS — M79641 Pain in right hand: Secondary | ICD-10-CM

## 2017-03-23 DIAGNOSIS — Z0289 Encounter for other administrative examinations: Secondary | ICD-10-CM

## 2017-03-23 NOTE — Progress Notes (Addendum)
Full Name: [name] Gender: Female MRN #: [id number] Date of Birth: 10-16-1940    Visit Date: 03/23/2017 09:38 Age: 77 Years 13 Months Old Examining Physician: Arlice Colt, MD      History:  [name] is a 77 year old woman with left leg numbness and pain and left hand numbness.     Nerve conduction studies:  The left median and ulnar motor and sensory responses were normal.    Left peroneal motor response was slightly reduced in amplitude with normal conduction velocity. The left tibial motor response was normal. Left superficial peroneal sensory response was absent.  EMG: Nearly EMG of selected muscles of the left leg was performed. There was mild chronic elevation in all of the L5 innervated muscles tested. Additionally, superimposed acute denervation was noted in the tibialis anterior muscle.   Mild chronic denervation was also noted in the vastus medialis muscle.  Impression: This NCV/EMG study shows the following: 1.  Left L5 chronic radiculopathy with superimposed active features. 2.  A milder L4 chronic radiculopathy cannot be ruled out. 3.  Absent sensory responses in the feet could be normal at this age though a very mild superimposed polyneuropathy cannot be ruled out.  [name] A. Felecia Shelling, MD, PhD, FAAN Certified in Neurology, Clinical Neurophysiology, Sleep Medicine, Pain Medicine and Neuroimaging Director, [place] [facility] [address] [place], [place] [address] [phone]       [facility]    Nerve / Sites Muscle Latency Ref. Amplitude Ref. Rel Amp Segments Distance Velocity Ref. Area    ms ms mV mV %  cm m/s m/s mVms  L Median - APB     Wrist APB 3.0 ?4.4 10.8 ?4.0 100 Wrist - APB 7   38.2     Upper arm APB 7.2  6.2  57.5 Upper arm - Wrist 25 59 ?49 26.3  L Ulnar - ADM     Wrist ADM 3.1 ?3.3 13.0 ?6.0 100 Wrist - ADM 7   45.3     B.Elbow ADM 6.6  11.2  85.7  B.Elbow - Wrist 22 64 ?49 38.0     A.Elbow ADM 8.1  12.0  108 A.Elbow - B.Elbow 10 64 ?49 41.3         A.Elbow - Wrist      L Peroneal - EDB     Ankle EDB 4.0 ?6.5 1.9 ?2.0 100 Ankle - EDB 9   6.9     Fib head EDB 11.3  1.4  76.1 Fib head - Ankle 38 52 ?44 6.4     Pop fossa EDB 12.9  1.2  84.2 Pop fossa - Fib head 10 64 ?44 5.5         Pop fossa - Ankle      L Tibial - AH     Ankle AH 5.9 ?5.8 4.1 ?4.0 100 Ankle - AH 9   10.5     Pop fossa AH 13.4  0.5  11.2 Pop fossa - Ankle 45 60 ?41 1.5             SNC    Nerve / Sites Rec. Site Peak Lat Amp Segments Distance    ms V  cm  L Median - Orthodromic (Dig II, Mid palm)     Dig II Wrist 3.3 50 Dig II - Wrist 13  L Ulnar - Orthodromic, (Dig V, Mid palm)  Dig V Wrist 3.3 14 Dig V - Wrist 11         SNC    Nerve / Sites Rec. Site Peak Lat Ref.  Amp Ref. Segments Distance    ms ms V V  cm  L Superficial peroneal - Ankle     Lat leg Ankle NR ?4.4 NR ?6 Lat leg - Ankle 14       F  Wave    Nerve F Lat Ref.   ms ms  L Median - APB 26.9 ?31.0  L Ulnar - ADM 27.9 ?32.0         EMG full       EMG Summary Table    Spontaneous MUAP Recruitment  Muscle IA Fib PSW Fasc Other Amp Dur. Poly Pattern  L. Tibialis anterior Normal 1+ 1+ None _______ Increased Increased 1+ Reduced  L. Peroneus longus Normal None None None _______ Normal Increased 1+ Reduced  L. Gastrocnemius (Medial head) Normal None None None _______ Normal Normal Normal Normal  L. Vastus medialis Normal None None None _______ Normal Increased 1+ Reduced  L. Iliopsoas Normal None None None _______ Normal Normal Normal Normal  L. Gluteus medius Normal None None None _______ Normal Increased 1+ Reduced

## 2017-03-23 NOTE — Addendum Note (Signed)
Addended by: Britt Bottom on: 03/23/2017 06:13 PM   Modules accepted: Orders

## 2017-03-31 DIAGNOSIS — M542 Cervicalgia: Secondary | ICD-10-CM | POA: Diagnosis not present

## 2017-03-31 DIAGNOSIS — M25512 Pain in left shoulder: Secondary | ICD-10-CM | POA: Diagnosis not present

## 2017-04-16 DIAGNOSIS — R202 Paresthesia of skin: Secondary | ICD-10-CM | POA: Diagnosis not present

## 2017-04-16 DIAGNOSIS — R350 Frequency of micturition: Secondary | ICD-10-CM | POA: Diagnosis not present

## 2017-04-16 DIAGNOSIS — M797 Fibromyalgia: Secondary | ICD-10-CM | POA: Diagnosis not present

## 2017-04-16 DIAGNOSIS — E038 Other specified hypothyroidism: Secondary | ICD-10-CM | POA: Diagnosis not present

## 2017-04-16 DIAGNOSIS — M25512 Pain in left shoulder: Secondary | ICD-10-CM | POA: Diagnosis not present

## 2017-04-26 DIAGNOSIS — L84 Corns and callosities: Secondary | ICD-10-CM | POA: Diagnosis not present

## 2017-04-26 DIAGNOSIS — E1151 Type 2 diabetes mellitus with diabetic peripheral angiopathy without gangrene: Secondary | ICD-10-CM | POA: Diagnosis not present

## 2017-04-26 DIAGNOSIS — I739 Peripheral vascular disease, unspecified: Secondary | ICD-10-CM | POA: Diagnosis not present

## 2017-04-26 DIAGNOSIS — L603 Nail dystrophy: Secondary | ICD-10-CM | POA: Diagnosis not present

## 2017-05-18 DIAGNOSIS — J309 Allergic rhinitis, unspecified: Secondary | ICD-10-CM | POA: Diagnosis not present

## 2017-05-18 DIAGNOSIS — R51 Headache: Secondary | ICD-10-CM | POA: Diagnosis not present

## 2017-05-18 DIAGNOSIS — J329 Chronic sinusitis, unspecified: Secondary | ICD-10-CM | POA: Diagnosis not present

## 2017-05-27 DIAGNOSIS — M7542 Impingement syndrome of left shoulder: Secondary | ICD-10-CM | POA: Diagnosis not present

## 2017-05-27 DIAGNOSIS — M25512 Pain in left shoulder: Secondary | ICD-10-CM | POA: Diagnosis not present

## 2017-06-14 DIAGNOSIS — M545 Low back pain: Secondary | ICD-10-CM | POA: Diagnosis not present

## 2017-06-14 DIAGNOSIS — M5416 Radiculopathy, lumbar region: Secondary | ICD-10-CM | POA: Diagnosis not present

## 2017-06-14 DIAGNOSIS — M179 Osteoarthritis of knee, unspecified: Secondary | ICD-10-CM | POA: Diagnosis not present

## 2017-06-14 DIAGNOSIS — M797 Fibromyalgia: Secondary | ICD-10-CM | POA: Diagnosis not present

## 2017-07-03 DIAGNOSIS — M51369 Other intervertebral disc degeneration, lumbar region without mention of lumbar back pain or lower extremity pain: Secondary | ICD-10-CM | POA: Insufficient documentation

## 2017-07-03 DIAGNOSIS — M5136 Other intervertebral disc degeneration, lumbar region: Secondary | ICD-10-CM | POA: Insufficient documentation

## 2017-07-03 DIAGNOSIS — M79604 Pain in right leg: Secondary | ICD-10-CM | POA: Diagnosis not present

## 2017-07-06 DIAGNOSIS — M25512 Pain in left shoulder: Secondary | ICD-10-CM | POA: Diagnosis not present

## 2017-07-14 DIAGNOSIS — M25512 Pain in left shoulder: Secondary | ICD-10-CM | POA: Diagnosis not present

## 2017-07-21 DIAGNOSIS — M7502 Adhesive capsulitis of left shoulder: Secondary | ICD-10-CM | POA: Diagnosis not present

## 2017-07-21 DIAGNOSIS — M25512 Pain in left shoulder: Secondary | ICD-10-CM | POA: Diagnosis not present

## 2017-08-04 DIAGNOSIS — M5136 Other intervertebral disc degeneration, lumbar region: Secondary | ICD-10-CM | POA: Diagnosis not present

## 2017-08-05 DIAGNOSIS — M25562 Pain in left knee: Secondary | ICD-10-CM | POA: Diagnosis not present

## 2017-08-25 DIAGNOSIS — E1151 Type 2 diabetes mellitus with diabetic peripheral angiopathy without gangrene: Secondary | ICD-10-CM | POA: Diagnosis not present

## 2017-08-25 DIAGNOSIS — L84 Corns and callosities: Secondary | ICD-10-CM | POA: Diagnosis not present

## 2017-08-25 DIAGNOSIS — L603 Nail dystrophy: Secondary | ICD-10-CM | POA: Diagnosis not present

## 2017-08-25 DIAGNOSIS — I739 Peripheral vascular disease, unspecified: Secondary | ICD-10-CM | POA: Diagnosis not present

## 2017-08-29 ENCOUNTER — Encounter (HOSPITAL_BASED_OUTPATIENT_CLINIC_OR_DEPARTMENT_OTHER): Payer: Self-pay | Admitting: Emergency Medicine

## 2017-08-29 ENCOUNTER — Other Ambulatory Visit: Payer: Self-pay

## 2017-08-29 ENCOUNTER — Emergency Department (HOSPITAL_BASED_OUTPATIENT_CLINIC_OR_DEPARTMENT_OTHER)
Admission: EM | Admit: 2017-08-29 | Discharge: 2017-08-29 | Disposition: A | Discharge status: Home / Self Care | Payer: Medicare Other | Attending: Emergency Medicine | Admitting: Emergency Medicine

## 2017-08-29 DIAGNOSIS — I10 Essential (primary) hypertension: Secondary | ICD-10-CM

## 2017-08-29 DIAGNOSIS — R0981 Nasal congestion: Secondary | ICD-10-CM | POA: Insufficient documentation

## 2017-08-29 DIAGNOSIS — M545 Low back pain: Secondary | ICD-10-CM | POA: Diagnosis not present

## 2017-08-29 DIAGNOSIS — R519 Headache, unspecified: Secondary | ICD-10-CM

## 2017-08-29 DIAGNOSIS — R0982 Postnasal drip: Secondary | ICD-10-CM | POA: Insufficient documentation

## 2017-08-29 DIAGNOSIS — Z79899 Other long term (current) drug therapy: Secondary | ICD-10-CM | POA: Insufficient documentation

## 2017-08-29 DIAGNOSIS — R51 Headache: Secondary | ICD-10-CM | POA: Insufficient documentation

## 2017-08-29 DIAGNOSIS — E119 Type 2 diabetes mellitus without complications: Secondary | ICD-10-CM | POA: Diagnosis not present

## 2017-08-29 LAB — URINALYSIS, ROUTINE W REFLEX MICROSCOPIC
Bilirubin Urine: NEGATIVE
Glucose, UA: NEGATIVE mg/dL
Hgb urine dipstick: NEGATIVE
KETONES UR: NEGATIVE mg/dL
LEUKOCYTES UA: NEGATIVE
NITRITE: NEGATIVE
Protein, ur: NEGATIVE mg/dL
SPECIFIC GRAVITY, URINE: 1.025 (ref 1.005–1.030)
pH: 5 (ref 5.0–8.0)

## 2017-08-29 MED ORDER — AMOXICILLIN-POT CLAVULANATE 875-125 MG PO TABS: 1.0000 | ORAL_TABLET | Freq: Two times a day (BID) | ORAL | 0 refills | Status: DC

## 2017-08-29 NOTE — ED Triage Notes (Addendum)
Pt c/o headache with sinus congestion onset yesterday. Pt reports 1 episode of diarrhea yesterday as well. Pt tried nasal spray without relief. Pt also c/o pain to left shoulder, pt has history of bursitis and recently had injections done at her Doctor office without relief.

## 2017-08-29 NOTE — Discharge Instructions (Signed)
1.  It is very important that you take your blood pressure medications and manage your high blood pressure.  Return immediately if you have blurred vision, confusion, loss of balance, weakness numbness or tingling in one side of your body. 2.  You appear to have a sinus headache.  Augmentin as prescribed.  You may take over-the-counter extra strength Tylenol every 6 hours and your prescribed tramadol together for pain control. 3.  If your symptoms worsen, change or new symptoms develop return to the emergency department

## 2017-08-29 NOTE — ED Notes (Signed)
2 attempts made to draw blood - unsuccessful

## 2017-08-29 NOTE — ED Provider Notes (Signed)
[facility] Provider Note   CSN: [id number] Arrival date & time: 08/29/17  1628     History   Chief Complaint Chief Complaint  Patient presents with  . Headache    HPI [name] is a 77 y.o. female.  HPI Patient reports she has a headache.  She reports that started yesterday.  (Notably however after much discussion it sounds as though she probably has had a headache over the past week).  She describes the pain is being around her forehead coming down her nose her face and then going back over the top of her head and down to her neck.  Patient does have known chronic neck pain and shoulder pain.  He has had injections for the shoulder.  She has fibromyalgia.  She denies that she typically gets headaches.  She reports however she has had been having a lot of sinus congestion.  She reports she has been having drainage and discharge.  No blood in it.  No fever.  She reports last night she woke up and had lower back pain so she put an ice pack under her and fell back asleep.  She reports then she had a headache this morning and pain radiating around her face and nose.  Got better for a while.  She did take some tramadol.  She reports she has spent a lot of time talking to her daughter on the phone.  She reports then later the headache came back again.  Patient has not taken her blood pressure medications.  Should have taken Tekturna, Bystolic and amlodipine.  She reports she has not taken them for a couple of days.  She has various reasons why not. Past Medical History:  Diagnosis Date  . Arthritis   . Bilateral leg pain   . Degenerative disc disease   . Diabetes mellitus without complication ([place])   . Fuch's endothelial dystrophy   . Hyperlipidemia   . Hypertension   . Obesity   . Plantar fasciitis   . Vertigo     Patient Active Problem List   Diagnosis Date Noted  . Left lumbar radiculopathy 02/25/2017  . Hip pain 02/25/2017  . Dysesthesia  02/25/2017  . Hand pain 02/25/2017  . Bilateral leg pain 12/19/2012  . OBESITY 12/10/2005  . INSOMNIA, CHRONIC 12/10/2005  . MIGRAINE HEADACHE 12/10/2005  . CATARACT NEC 12/10/2005  . TINNITUS 12/10/2005  . HYPERTENSION 12/10/2005  . SINUSITIS, CHRONIC 12/10/2005  . ALOPECIA 12/10/2005  . Osteoarthrosis, unspecified whether generalized or localized, unspecified site 12/10/2005  . HEMORRHOIDS, HX OF 12/10/2005    Past Surgical History:  Procedure Laterality Date  . CATARACT EXTRACTION Right   . CATARACT EXTRACTION Left   . ROTATOR CUFF REPAIR Right 07/22/2011  . TOTAL KNEE ARTHROPLASTY Right 04/15/2008  . TOTAL KNEE ARTHROPLASTY Left   . TOTAL VAGINAL HYSTERECTOMY       OB History   None      Home Medications    Prior to Admission medications   Medication Sig Start Date End Date Taking? Authorizing Provider  acetaminophen (TYLENOL) 325 MG tablet Take by mouth.    [provider]  Aliskiren-Hydrochlorothiazide (TEKTURNA HCT) 300-12.5 MG TABS Take 1 tablet by mouth daily.    [provider]  amLODipine (NORVASC) 10 MG tablet Take 10 mg by mouth daily.    [provider]  amoxicillin (AMOXIL) 500 MG capsule Take 2,000 mg by mouth See admin instructions. 30 to 60 minutes before a  dental appointment.    [provider]  amoxicillin-clavulanate (AUGMENTIN) 875-125 MG tablet Take 1 tablet by mouth every 12 (twelve) hours. 05/25/16   Carlisle Cater, PA-C  amoxicillin-clavulanate (AUGMENTIN) 875-125 MG tablet Take 1 tablet by mouth 2 (two) times daily. One po bid x 7 days 08/29/17   Charlesetta Shanks, MD  aspirin EC 81 MG tablet Take 81 mg by mouth daily as needed for pain.    [provider]  b complex vitamins tablet Take 1 tablet by mouth daily.    [provider]  Chlorphen-PE-Acetaminophen (NOREL AD) 4-10-325 MG TABS Take by mouth.    [provider]  Cholecalciferol (VITAMIN D3) 2000 UNITS capsule Take 2,000 Units by mouth  daily.    [provider]  ciclesonide (OMNARIS) 50 MCG/ACT nasal spray Place 2 sprays into both nostrils as needed for allergies.    [provider]  diclofenac sodium (VOLTAREN) 1 % GEL Apply 2 g topically daily as needed (pain).     [provider]  DULoxetine (CYMBALTA) 30 MG capsule TK 1 C PO QD 12/25/16   [provider]  fluticasone (VERAMYST) 27.5 MCG/SPRAY nasal spray Place 2 sprays into the nose as needed for rhinitis.    [provider]  ketotifen (RA ANTIHISTAMINE EYE DROPS) 0.025 % ophthalmic solution Place 1 drop into both eyes daily as needed (allergies).     [provider]  Menthol (BIOFREEZE) 10 % AERO Apply topically.    [provider]  nebivolol (BYSTOLIC) 5 MG tablet Take 6.25 mg by mouth daily.     [provider]  omeprazole (PRILOSEC) 20 MG capsule Take 20 mg by mouth daily.    [provider]  Polyethyl Glycol-Propyl Glycol (SYSTANE ULTRA) 0.4-0.3 % SOLN Apply 1 drop to eye daily as needed (dryness).     [provider]  rosuvastatin (CRESTOR) 20 MG tablet Take 20 mg by mouth daily.    [provider]  trolamine salicylate (ASPERCREME) 10 % cream Apply 1 application topically 2 (two) times daily as needed for muscle pain.     [provider]    Family History Family History  Problem Relation Age of Onset  . Diabetes Mother   . Stroke Mother   . Heart attack Paternal Aunt        Massive MI  . Heart attack Paternal Aunt        Massive MI  . Diabetes Son   . Hypertension Son   . Diabetes Son   . Hypertension Son   . Hyperlipidemia Son   . Hypertension Daughter     Social History Social History   Tobacco Use  . Smoking status: Never Smoker  . Smokeless tobacco: Never Used  Substance Use Topics  . Alcohol use: No  . Drug use: Never     Allergies   Codeine; Diovan [valsartan]; Metoprolol tartrate; and Zocor [simvastatin]   Review of  Systems Review of Systems 10 Systems reviewed and are negative for acute change except as noted in the HPI.   Physical Exam Updated Vital Signs BP (!) 191/74   Pulse 69   Temp 98 F (36.7 C) (Oral)   Resp 18   Ht 4\' 3"  (1.295 m)   Wt 99.8 kg (220 lb)   SpO2 99%   BMI 59.47 kg/m   Physical Exam  Constitutional: She is oriented to person, place, and time. She appears well-developed and well-nourished.  Patient is alert and cheerful.  She is  jovial and joking with myself and her daughters.  HENT:  Head: Normocephalic and atraumatic.  Bilateral TMs normal.  Nares patent without drainage or blood.  Posterior oropharynx widely patent.  No facial swelling.  Patient does endorse percussion tenderness over the left maxillary sinus and left temple and zygoma.  Eyes: Pupils are equal, round, and reactive to light. EOM are normal.  Neck: Neck supple.  Cardiovascular: Normal rate, regular rhythm, normal heart sounds and intact distal pulses.  Pulmonary/Chest: Effort normal and breath sounds normal.  Abdominal: Soft. Bowel sounds are normal. She exhibits no distension. There is no tenderness.  Musculoskeletal: Normal range of motion. She exhibits no edema.  Neurological: She is alert and oriented to person, place, and time. She has normal strength. No cranial nerve deficit or sensory deficit. She exhibits normal muscle tone. Coordination normal. GCS eye subscore is 4. GCS verbal subscore is 5. GCS motor subscore is 6.  Skin: Skin is warm, dry and intact.  Psychiatric: She has a normal mood and affect.     ED Treatments / Results  Labs (all labs ordered are listed, but only abnormal results are displayed) Labs Reviewed  URINALYSIS, ROUTINE W REFLEX MICROSCOPIC    EKG None  Radiology No results found.  Procedures Procedures (including critical care time)  Medications Ordered in ED Medications - No data to display   Initial Impression / Assessment and Plan / ED Course  I have  reviewed the triage vital signs and the nursing notes.  Pertinent labs & imaging results that were available during my care of the patient were reviewed by me and considered in my medical decision making (see chart for details).      Final Clinical Impressions(s) / ED Diagnoses   Final diagnoses:  Sinus headache  Essential hypertension   Patient is clinically well.  She describes a headache that has been waxing and waning.  Much of her pain involves her forehead and nose and maxilla.  Patient had similar presentation approximately 1 year ago and was treated for sinusitis.  She reports she did improve with treatment.  She has no confusion, no lethargy, no blurred vision, no chest pain.  No signs of hypertensive emergency.  Patient has been noncompliant with her blood pressure medications.  She has various reasons why.  She is however very alert and sharp.  She is well aware of the necessity of taking her medications but at times does not wish to.  She also has fibromyalgia and chronic neck and shoulder pain which may be contributing to symptoms.  Patient is already under therapy for these.  Plan and return precautions reviewed.  Patient was instructed to take her own Tekturna, amlodipine and Bystolic in the emergency department prior to discharge. ED Discharge Orders        Ordered    amoxicillin-clavulanate (AUGMENTIN) 875-125 MG tablet  2 times daily     08/29/17 2018       Charlesetta Shanks, MD 08/29/17 2022

## 2017-08-29 NOTE — ED Notes (Signed)
Pt and family given d/c instructions as per chart. Rx x 1. Verbalizes understanding. No questions.

## 2017-08-29 NOTE — ED Notes (Signed)
Pt has been assessed by Dr. Johnney Killian. Labs cancelled. She is taking her home meds per EDP order

## 2017-09-06 DIAGNOSIS — M7550 Bursitis of unspecified shoulder: Secondary | ICD-10-CM | POA: Diagnosis not present

## 2017-09-06 DIAGNOSIS — M7580 Other shoulder lesions, unspecified shoulder: Secondary | ICD-10-CM | POA: Diagnosis not present

## 2017-11-24 DIAGNOSIS — L603 Nail dystrophy: Secondary | ICD-10-CM | POA: Diagnosis not present

## 2017-11-24 DIAGNOSIS — L84 Corns and callosities: Secondary | ICD-10-CM | POA: Diagnosis not present

## 2017-11-24 DIAGNOSIS — I739 Peripheral vascular disease, unspecified: Secondary | ICD-10-CM | POA: Diagnosis not present

## 2017-11-24 DIAGNOSIS — E1151 Type 2 diabetes mellitus with diabetic peripheral angiopathy without gangrene: Secondary | ICD-10-CM | POA: Diagnosis not present

## 2017-12-06 DIAGNOSIS — G629 Polyneuropathy, unspecified: Secondary | ICD-10-CM | POA: Diagnosis not present

## 2017-12-06 DIAGNOSIS — H9113 Presbycusis, bilateral: Secondary | ICD-10-CM | POA: Diagnosis not present

## 2017-12-06 DIAGNOSIS — Z0000 Encounter for general adult medical examination without abnormal findings: Secondary | ICD-10-CM | POA: Diagnosis not present

## 2017-12-06 DIAGNOSIS — Z79899 Other long term (current) drug therapy: Secondary | ICD-10-CM | POA: Diagnosis not present

## 2017-12-06 DIAGNOSIS — E1122 Type 2 diabetes mellitus with diabetic chronic kidney disease: Secondary | ICD-10-CM | POA: Diagnosis not present

## 2017-12-06 DIAGNOSIS — I129 Hypertensive chronic kidney disease with stage 1 through stage 4 chronic kidney disease, or unspecified chronic kidney disease: Secondary | ICD-10-CM | POA: Diagnosis not present

## 2017-12-06 DIAGNOSIS — Z23 Encounter for immunization: Secondary | ICD-10-CM | POA: Diagnosis not present

## 2017-12-06 DIAGNOSIS — N183 Chronic kidney disease, stage 3 (moderate): Secondary | ICD-10-CM | POA: Diagnosis not present

## 2017-12-06 DIAGNOSIS — E782 Mixed hyperlipidemia: Secondary | ICD-10-CM | POA: Diagnosis not present

## 2017-12-06 DIAGNOSIS — M7580 Other shoulder lesions, unspecified shoulder: Secondary | ICD-10-CM | POA: Diagnosis not present

## 2017-12-06 DIAGNOSIS — E559 Vitamin D deficiency, unspecified: Secondary | ICD-10-CM | POA: Diagnosis not present

## 2018-02-08 ENCOUNTER — Other Ambulatory Visit: Payer: Self-pay | Admitting: Family Medicine

## 2018-02-08 DIAGNOSIS — Z1231 Encounter for screening mammogram for malignant neoplasm of breast: Secondary | ICD-10-CM

## 2018-02-28 DIAGNOSIS — J0100 Acute maxillary sinusitis, unspecified: Secondary | ICD-10-CM | POA: Diagnosis not present

## 2018-03-04 DIAGNOSIS — I739 Peripheral vascular disease, unspecified: Secondary | ICD-10-CM | POA: Diagnosis not present

## 2018-03-04 DIAGNOSIS — L603 Nail dystrophy: Secondary | ICD-10-CM | POA: Diagnosis not present

## 2018-03-04 DIAGNOSIS — L84 Corns and callosities: Secondary | ICD-10-CM | POA: Diagnosis not present

## 2018-03-04 DIAGNOSIS — E1151 Type 2 diabetes mellitus with diabetic peripheral angiopathy without gangrene: Secondary | ICD-10-CM | POA: Diagnosis not present

## 2018-03-18 ENCOUNTER — Ambulatory Visit
Admission: RE | Admit: 2018-03-18 | Discharge: 2018-03-18 | Disposition: A | Discharge status: Home / Self Care | Payer: Medicare Other | Source: Ambulatory Visit | Attending: Family Medicine | Admitting: Family Medicine

## 2018-03-18 DIAGNOSIS — Z1231 Encounter for screening mammogram for malignant neoplasm of breast: Secondary | ICD-10-CM

## 2018-04-04 DIAGNOSIS — M25511 Pain in right shoulder: Secondary | ICD-10-CM | POA: Diagnosis not present

## 2018-04-08 DIAGNOSIS — N183 Chronic kidney disease, stage 3 (moderate): Secondary | ICD-10-CM | POA: Diagnosis not present

## 2018-04-08 DIAGNOSIS — F325 Major depressive disorder, single episode, in full remission: Secondary | ICD-10-CM | POA: Diagnosis not present

## 2018-04-08 DIAGNOSIS — J309 Allergic rhinitis, unspecified: Secondary | ICD-10-CM | POA: Diagnosis not present

## 2018-04-08 DIAGNOSIS — I10 Essential (primary) hypertension: Secondary | ICD-10-CM | POA: Diagnosis not present

## 2018-12-15 DIAGNOSIS — Z23 Encounter for immunization: Secondary | ICD-10-CM | POA: Diagnosis not present

## 2019-01-04 DIAGNOSIS — H02886 Meibomian gland dysfunction of left eye, unspecified eyelid: Secondary | ICD-10-CM | POA: Diagnosis not present

## 2019-01-04 DIAGNOSIS — H02883 Meibomian gland dysfunction of right eye, unspecified eyelid: Secondary | ICD-10-CM | POA: Diagnosis not present

## 2019-01-04 DIAGNOSIS — Z961 Presence of intraocular lens: Secondary | ICD-10-CM | POA: Diagnosis not present

## 2019-01-04 DIAGNOSIS — H524 Presbyopia: Secondary | ICD-10-CM | POA: Diagnosis not present

## 2019-01-04 DIAGNOSIS — H18513 Endothelial corneal dystrophy, bilateral: Secondary | ICD-10-CM | POA: Diagnosis not present

## 2019-02-07 DIAGNOSIS — K219 Gastro-esophageal reflux disease without esophagitis: Secondary | ICD-10-CM | POA: Diagnosis not present

## 2019-02-07 DIAGNOSIS — I10 Essential (primary) hypertension: Secondary | ICD-10-CM | POA: Diagnosis not present

## 2019-02-07 DIAGNOSIS — R209 Unspecified disturbances of skin sensation: Secondary | ICD-10-CM | POA: Diagnosis not present

## 2019-02-07 DIAGNOSIS — Z79899 Other long term (current) drug therapy: Secondary | ICD-10-CM | POA: Diagnosis not present

## 2019-02-07 DIAGNOSIS — N183 Chronic kidney disease, stage 3 unspecified: Secondary | ICD-10-CM | POA: Diagnosis not present

## 2019-02-07 DIAGNOSIS — E038 Other specified hypothyroidism: Secondary | ICD-10-CM | POA: Diagnosis not present

## 2019-02-07 DIAGNOSIS — E538 Deficiency of other specified B group vitamins: Secondary | ICD-10-CM | POA: Diagnosis not present

## 2019-02-07 DIAGNOSIS — E559 Vitamin D deficiency, unspecified: Secondary | ICD-10-CM | POA: Diagnosis not present

## 2019-02-07 DIAGNOSIS — Z0000 Encounter for general adult medical examination without abnormal findings: Secondary | ICD-10-CM | POA: Diagnosis not present

## 2019-02-07 DIAGNOSIS — E782 Mixed hyperlipidemia: Secondary | ICD-10-CM | POA: Diagnosis not present

## 2019-02-07 DIAGNOSIS — E1122 Type 2 diabetes mellitus with diabetic chronic kidney disease: Secondary | ICD-10-CM | POA: Diagnosis not present

## 2019-02-21 ENCOUNTER — Other Ambulatory Visit: Payer: Self-pay | Admitting: Family Medicine

## 2019-02-21 DIAGNOSIS — Z1231 Encounter for screening mammogram for malignant neoplasm of breast: Secondary | ICD-10-CM

## 2019-03-21 DIAGNOSIS — M5416 Radiculopathy, lumbar region: Secondary | ICD-10-CM | POA: Diagnosis not present

## 2019-03-21 DIAGNOSIS — M5136 Other intervertebral disc degeneration, lumbar region: Secondary | ICD-10-CM | POA: Diagnosis not present

## 2019-04-05 ENCOUNTER — Ambulatory Visit: Payer: Medicare Other

## 2019-05-10 ENCOUNTER — Ambulatory Visit: Payer: Medicare Other

## 2019-06-20 ENCOUNTER — Ambulatory Visit
Admission: RE | Admit: 2019-06-20 | Discharge: 2019-06-20 | Disposition: A | Discharge status: Home / Self Care | Payer: Medicare Other | Source: Ambulatory Visit | Attending: Family Medicine | Admitting: Family Medicine

## 2019-06-20 ENCOUNTER — Other Ambulatory Visit: Payer: Self-pay

## 2019-06-20 DIAGNOSIS — Z1231 Encounter for screening mammogram for malignant neoplasm of breast: Secondary | ICD-10-CM

## 2019-09-28 DIAGNOSIS — R413 Other amnesia: Secondary | ICD-10-CM | POA: Diagnosis not present

## 2019-09-28 DIAGNOSIS — N183 Chronic kidney disease, stage 3 unspecified: Secondary | ICD-10-CM | POA: Diagnosis not present

## 2019-09-28 DIAGNOSIS — I10 Essential (primary) hypertension: Secondary | ICD-10-CM | POA: Diagnosis not present

## 2019-09-28 DIAGNOSIS — M79671 Pain in right foot: Secondary | ICD-10-CM | POA: Diagnosis not present

## 2019-09-28 DIAGNOSIS — E1122 Type 2 diabetes mellitus with diabetic chronic kidney disease: Secondary | ICD-10-CM | POA: Diagnosis not present

## 2019-10-16 ENCOUNTER — Other Ambulatory Visit: Payer: Self-pay

## 2019-10-16 ENCOUNTER — Ambulatory Visit (INDEPENDENT_AMBULATORY_CARE_PROVIDER_SITE_OTHER): Payer: Medicare Other

## 2019-10-16 ENCOUNTER — Other Ambulatory Visit: Payer: Self-pay | Admitting: Podiatry

## 2019-10-16 ENCOUNTER — Ambulatory Visit (INDEPENDENT_AMBULATORY_CARE_PROVIDER_SITE_OTHER): Payer: Medicare Other | Admitting: Podiatry

## 2019-10-16 DIAGNOSIS — M7750 Other enthesopathy of unspecified foot: Secondary | ICD-10-CM

## 2019-10-16 DIAGNOSIS — M1990 Unspecified osteoarthritis, unspecified site: Secondary | ICD-10-CM

## 2019-10-16 DIAGNOSIS — M19079 Primary osteoarthritis, unspecified ankle and foot: Secondary | ICD-10-CM

## 2019-10-16 DIAGNOSIS — M79675 Pain in left toe(s): Secondary | ICD-10-CM | POA: Diagnosis not present

## 2019-10-16 DIAGNOSIS — M79674 Pain in right toe(s): Secondary | ICD-10-CM | POA: Diagnosis not present

## 2019-10-16 DIAGNOSIS — M779 Enthesopathy, unspecified: Secondary | ICD-10-CM

## 2019-10-16 DIAGNOSIS — M7751 Other enthesopathy of right foot: Secondary | ICD-10-CM

## 2019-10-16 DIAGNOSIS — R609 Edema, unspecified: Secondary | ICD-10-CM

## 2019-10-16 DIAGNOSIS — M7752 Other enthesopathy of left foot: Secondary | ICD-10-CM | POA: Diagnosis not present

## 2019-10-16 DIAGNOSIS — B351 Tinea unguium: Secondary | ICD-10-CM

## 2019-10-16 MED ORDER — DICLOFENAC SODIUM 1 % EX GEL: 2.0000 g | Freq: Four times a day (QID) | CUTANEOUS | 2 refills | Status: AC

## 2019-10-16 MED ORDER — CICLOPIROX 8 % EX SOLN: Freq: Every day | CUTANEOUS | 4 refills | Status: DC

## 2019-10-17 ENCOUNTER — Telehealth: Payer: Self-pay | Admitting: Podiatry

## 2019-10-17 ENCOUNTER — Ambulatory Visit (HOSPITAL_COMMUNITY)
Admission: RE | Admit: 2019-10-17 | Discharge: 2019-10-17 | Disposition: A | Discharge status: Home / Self Care | Payer: Medicare Other | Source: Ambulatory Visit | Attending: Cardiovascular Disease | Admitting: Cardiovascular Disease

## 2019-10-17 DIAGNOSIS — R609 Edema, unspecified: Secondary | ICD-10-CM | POA: Insufficient documentation

## 2019-10-17 NOTE — Telephone Encounter (Signed)
Lattie Haw- can you call and let them know bilateral. Thanks.

## 2019-10-17 NOTE — Telephone Encounter (Signed)
[facility] wanted to know if we needed right, left or both legs. Needed clarification on orders sent over.

## 2019-10-18 ENCOUNTER — Telehealth: Payer: Self-pay | Admitting: *Deleted

## 2019-10-18 NOTE — Telephone Encounter (Signed)
Called and left a message for the patient to go to the local pharmacy and pick up the voltaren gel over the counter and if any concerns and questions. Lattie Haw

## 2019-10-18 NOTE — Progress Notes (Signed)
Subjective:   Patient ID: [name], female   DOB: 79 y.o.   MRN: [id number]   HPI 79 year old female presents the office today for concerns of thick, discolored toenails that she cannot trim her self.  She also states that she has some discomfort on the instep on her left side and she feels a tightness in her parts.  She denies recent injury or trauma.  She has had some swelling to her legs as well.  She has a history of degenerative disc disease and she is treated by Dr. Herma Mering at [facility].  She believes this is what causes her to be off balance at times.  She has no other concerns today.   ROS  Past Medical History:  Diagnosis Date  . Arthritis   . Bilateral leg pain   . Degenerative disc disease   . Diabetes mellitus without complication ([place])   . Fuch's endothelial dystrophy   . Hyperlipidemia   . Hypertension   . Obesity   . Plantar fasciitis   . Vertigo     Past Surgical History:  Procedure Laterality Date  . CATARACT EXTRACTION Right   . CATARACT EXTRACTION Left   . ROTATOR CUFF REPAIR Right 07/22/2011  . TOTAL KNEE ARTHROPLASTY Right 04/15/2008  . TOTAL KNEE ARTHROPLASTY Left   . TOTAL VAGINAL HYSTERECTOMY       Current Outpatient Medications:  .  cyclobenzaprine (FLEXERIL) 5 MG tablet, Take 5 mg by mouth 3 (three) times daily as needed for muscle spasms., Disp: , Rfl:  .  FLUCONAZOLE PO, Take by mouth., Disp: , Rfl:  .  Fluocinonide 0.1 % CREA, Apply topically., Disp: , Rfl:  .  NAPROXEN PO, Take by mouth., Disp: , Rfl:  .  traMADol (ULTRAM) 50 MG tablet, Take by mouth every 6 (six) hours as needed., Disp: , Rfl:  .  acetaminophen (TYLENOL) 325 MG tablet, Take by mouth., Disp: , Rfl:  .  Aliskiren-Hydrochlorothiazide (TEKTURNA HCT) 300-12.5 MG TABS, Take 1 tablet by mouth daily., Disp: , Rfl:  .  amLODipine (NORVASC) 10 MG tablet, Take 10 mg by mouth daily., Disp: , Rfl:  .  aspirin EC 81 MG tablet, Take 81 mg by mouth daily as needed for pain.,  Disp: , Rfl:  .  b complex vitamins tablet, Take 1 tablet by mouth daily., Disp: , Rfl:  .  ciclopirox (PENLAC) 8 % solution, Apply topically at bedtime. Apply over nail and surrounding skin. Apply daily over previous coat. After seven (7) days, may remove with alcohol and continue cycle., Disp: 6.6 mL, Rfl: 4 .  diclofenac Sodium (VOLTAREN) 1 % GEL, Apply 2 g topically 4 (four) times daily. Rub into affected area of foot 2 to 4 times daily, Disp: 100 g, Rfl: 2 .  DULoxetine (CYMBALTA) 30 MG capsule, TK 1 C PO QD, Disp: , Rfl:  .  fluticasone (VERAMYST) 27.5 MCG/SPRAY nasal spray, Place 2 sprays into the nose as needed for rhinitis., Disp: , Rfl:  .  ketotifen (RA ANTIHISTAMINE EYE DROPS) 0.025 % ophthalmic solution, Place 1 drop into both eyes daily as needed (allergies). , Disp: , Rfl:  .  Menthol (BIOFREEZE) 10 % AERO, Apply topically., Disp: , Rfl:  .  nebivolol (BYSTOLIC) 5 MG tablet, Take 6.25 mg by mouth daily. , Disp: , Rfl:  .  rosuvastatin (CRESTOR) 20 MG tablet, Take 20 mg by mouth daily., Disp: , Rfl:  .  trolamine salicylate (ASPERCREME) 10 % cream, Apply 1 application topically  2 (two) times daily as needed for muscle pain. , Disp: , Rfl:   Allergies  Allergen Reactions  . Codeine     Shakes   . Diovan [Valsartan]     Shaking  . Metoprolol Tartrate     REACTION: actually allergy to beta blocker  . Zocor [Simvastatin]     Alopecia        Objective:  Physical Exam  General: AAO x3, NAD  Dermatological: Nails are hypertrophic, dystrophic, brittle, discolored, elongated 10. No surrounding redness or drainage. Tenderness nails 1-5 bilaterally. No open lesions or pre-ulcerative lesions are identified today.  Vascular: Dorsalis Pedis artery and Posterior Tibial artery pedal pulses are 2/4 bilateral with immedate capillary fill time.  There is swelling bilaterally to the calf with right side worse than left.  Neruologic: Grossly intact via light touch bilateral.    Musculoskeletal: Mild tenderness palpation on medial aspect of bilateral foot mostly along the talonavicular, reviewed cuneiform joint.  Also mild discomfort along the course of the plantar fascia.  The plantar fascia appears to be intact.  No pain on the Achilles tendon.  No area pinpoint tenderness otherwise.  Muscular strength 5/5 in all groups tested bilateral.  Gait: Unassisted, Nonantalgic.      Assessment:   Bilateral foot pain, arthritis/plantar fasciitis; symptomatic onychomycosis; swelling     Plan:  -Treatment options discussed including all alternatives, risks, and complications -Etiology of symptoms were discussed -X-rays were obtained and reviewed with the patient. Arthritic changes present to the TNJ and NCJ. Heel spurs present -Regards to bilateral foot pain discussed shoe modifications, inserts. Discussed possible PT in order to help with gait and foot pain. She wants to hold off on that for now. -Due to swelling ordered venous duplex -Sharply debrided the toenails x10 without any complications or bleeding.  Prescribed Penlac.  Trula Slade DPM

## 2019-10-18 NOTE — Telephone Encounter (Signed)
Patient had the test done yesterday and they did bi-lateral. Lattie Haw

## 2019-10-18 NOTE — Telephone Encounter (Signed)
Called and left a message for the patient and relayed the message per Dr Wagoner. Geno Sydnor 

## 2019-11-13 ENCOUNTER — Ambulatory Visit (INDEPENDENT_AMBULATORY_CARE_PROVIDER_SITE_OTHER): Payer: Medicare Other | Admitting: Podiatry

## 2019-11-13 ENCOUNTER — Other Ambulatory Visit: Payer: Self-pay

## 2019-11-13 DIAGNOSIS — M722 Plantar fascial fibromatosis: Secondary | ICD-10-CM

## 2019-11-13 DIAGNOSIS — M19079 Primary osteoarthritis, unspecified ankle and foot: Secondary | ICD-10-CM

## 2019-11-16 DIAGNOSIS — M797 Fibromyalgia: Secondary | ICD-10-CM | POA: Diagnosis not present

## 2019-11-16 DIAGNOSIS — M25562 Pain in left knee: Secondary | ICD-10-CM | POA: Diagnosis not present

## 2019-11-16 DIAGNOSIS — R2681 Unsteadiness on feet: Secondary | ICD-10-CM | POA: Diagnosis not present

## 2019-11-16 DIAGNOSIS — M25561 Pain in right knee: Secondary | ICD-10-CM | POA: Diagnosis not present

## 2019-11-16 DIAGNOSIS — M722 Plantar fascial fibromatosis: Secondary | ICD-10-CM | POA: Diagnosis not present

## 2019-11-16 DIAGNOSIS — M25673 Stiffness of unspecified ankle, not elsewhere classified: Secondary | ICD-10-CM | POA: Diagnosis not present

## 2019-11-16 DIAGNOSIS — M6281 Muscle weakness (generalized): Secondary | ICD-10-CM | POA: Diagnosis not present

## 2019-11-16 DIAGNOSIS — M25473 Effusion, unspecified ankle: Secondary | ICD-10-CM | POA: Diagnosis not present

## 2019-11-17 NOTE — Progress Notes (Signed)
Subjective: 79 year old female presents the office today for follow-up evaluation of bilateral foot pain, plantar fasciitis, arthritis.  She states that overall she is doing somewhat better but still having some discomfort.  She states that right now the pain is not bad.  She states that her pain will often "tighten up" with the arches.  She is not using Voltaren gel.  At max her pain level is 5/10. Denies any systemic complaints such as fevers, chills, nausea, vomiting. No acute changes since last appointment, and no other complaints at this time.   Venous ultrasound was negative.  Objective: AAO x3, NAD DP/PT pulses palpable bilaterally, CRT less than 3 seconds There is no specific area of pinpoint tenderness identified on today's exam.  There is mild tenderness along the medial band plantar fashion the arch of the foot as well as the talonavicular joint.  Flexor, extensor tendons appear to be intact.  Achilles tendon, plantar fascia appear to be intact.  No pain with calf compression, swelling, warmth, erythema  Assessment: Plan fasciitis, arthritis  Plan: -All treatment options discussed with the patient including all alternatives, risks, complications.  -At this point we discussed formal PT and a referral was placed for [facility]. we discussed shoe modifications instructions at home exercises to be performed at home. -Patient encouraged to call the office with any questions, concerns, change in symptoms.

## 2019-11-20 DIAGNOSIS — M722 Plantar fascial fibromatosis: Secondary | ICD-10-CM | POA: Diagnosis not present

## 2019-11-20 DIAGNOSIS — M25562 Pain in left knee: Secondary | ICD-10-CM | POA: Diagnosis not present

## 2019-11-20 DIAGNOSIS — M25673 Stiffness of unspecified ankle, not elsewhere classified: Secondary | ICD-10-CM | POA: Diagnosis not present

## 2019-11-20 DIAGNOSIS — M6281 Muscle weakness (generalized): Secondary | ICD-10-CM | POA: Diagnosis not present

## 2019-11-20 DIAGNOSIS — M25561 Pain in right knee: Secondary | ICD-10-CM | POA: Diagnosis not present

## 2019-11-20 DIAGNOSIS — R2681 Unsteadiness on feet: Secondary | ICD-10-CM | POA: Diagnosis not present

## 2019-11-20 DIAGNOSIS — M25473 Effusion, unspecified ankle: Secondary | ICD-10-CM | POA: Diagnosis not present

## 2019-11-20 DIAGNOSIS — M797 Fibromyalgia: Secondary | ICD-10-CM | POA: Diagnosis not present

## 2019-11-22 DIAGNOSIS — M797 Fibromyalgia: Secondary | ICD-10-CM | POA: Diagnosis not present

## 2019-11-22 DIAGNOSIS — M25562 Pain in left knee: Secondary | ICD-10-CM | POA: Diagnosis not present

## 2019-11-22 DIAGNOSIS — R2681 Unsteadiness on feet: Secondary | ICD-10-CM | POA: Diagnosis not present

## 2019-11-22 DIAGNOSIS — M25561 Pain in right knee: Secondary | ICD-10-CM | POA: Diagnosis not present

## 2019-11-22 DIAGNOSIS — M722 Plantar fascial fibromatosis: Secondary | ICD-10-CM | POA: Diagnosis not present

## 2019-11-22 DIAGNOSIS — M25673 Stiffness of unspecified ankle, not elsewhere classified: Secondary | ICD-10-CM | POA: Diagnosis not present

## 2019-11-22 DIAGNOSIS — M25473 Effusion, unspecified ankle: Secondary | ICD-10-CM | POA: Diagnosis not present

## 2019-11-22 DIAGNOSIS — M6281 Muscle weakness (generalized): Secondary | ICD-10-CM | POA: Diagnosis not present

## 2019-11-24 DIAGNOSIS — M722 Plantar fascial fibromatosis: Secondary | ICD-10-CM | POA: Diagnosis not present

## 2019-11-24 DIAGNOSIS — M25673 Stiffness of unspecified ankle, not elsewhere classified: Secondary | ICD-10-CM | POA: Diagnosis not present

## 2019-11-24 DIAGNOSIS — M25473 Effusion, unspecified ankle: Secondary | ICD-10-CM | POA: Diagnosis not present

## 2019-11-24 DIAGNOSIS — R2681 Unsteadiness on feet: Secondary | ICD-10-CM | POA: Diagnosis not present

## 2019-11-24 DIAGNOSIS — M25561 Pain in right knee: Secondary | ICD-10-CM | POA: Diagnosis not present

## 2019-11-24 DIAGNOSIS — M25562 Pain in left knee: Secondary | ICD-10-CM | POA: Diagnosis not present

## 2019-11-24 DIAGNOSIS — M797 Fibromyalgia: Secondary | ICD-10-CM | POA: Diagnosis not present

## 2019-11-24 DIAGNOSIS — M6281 Muscle weakness (generalized): Secondary | ICD-10-CM | POA: Diagnosis not present

## 2019-11-27 DIAGNOSIS — R2681 Unsteadiness on feet: Secondary | ICD-10-CM | POA: Diagnosis not present

## 2019-11-27 DIAGNOSIS — M25673 Stiffness of unspecified ankle, not elsewhere classified: Secondary | ICD-10-CM | POA: Diagnosis not present

## 2019-11-27 DIAGNOSIS — M25561 Pain in right knee: Secondary | ICD-10-CM | POA: Diagnosis not present

## 2019-11-27 DIAGNOSIS — M25473 Effusion, unspecified ankle: Secondary | ICD-10-CM | POA: Diagnosis not present

## 2019-11-27 DIAGNOSIS — M25562 Pain in left knee: Secondary | ICD-10-CM | POA: Diagnosis not present

## 2019-11-27 DIAGNOSIS — M797 Fibromyalgia: Secondary | ICD-10-CM | POA: Diagnosis not present

## 2019-11-27 DIAGNOSIS — M722 Plantar fascial fibromatosis: Secondary | ICD-10-CM | POA: Diagnosis not present

## 2019-11-27 DIAGNOSIS — M6281 Muscle weakness (generalized): Secondary | ICD-10-CM | POA: Diagnosis not present

## 2019-11-29 DIAGNOSIS — M25561 Pain in right knee: Secondary | ICD-10-CM | POA: Diagnosis not present

## 2019-11-29 DIAGNOSIS — M25473 Effusion, unspecified ankle: Secondary | ICD-10-CM | POA: Diagnosis not present

## 2019-11-29 DIAGNOSIS — M25673 Stiffness of unspecified ankle, not elsewhere classified: Secondary | ICD-10-CM | POA: Diagnosis not present

## 2019-11-29 DIAGNOSIS — M6281 Muscle weakness (generalized): Secondary | ICD-10-CM | POA: Diagnosis not present

## 2019-11-29 DIAGNOSIS — M25562 Pain in left knee: Secondary | ICD-10-CM | POA: Diagnosis not present

## 2019-11-29 DIAGNOSIS — R2681 Unsteadiness on feet: Secondary | ICD-10-CM | POA: Diagnosis not present

## 2019-11-29 DIAGNOSIS — M797 Fibromyalgia: Secondary | ICD-10-CM | POA: Diagnosis not present

## 2019-11-29 DIAGNOSIS — M722 Plantar fascial fibromatosis: Secondary | ICD-10-CM | POA: Diagnosis not present

## 2019-12-01 DIAGNOSIS — R2681 Unsteadiness on feet: Secondary | ICD-10-CM | POA: Diagnosis not present

## 2019-12-01 DIAGNOSIS — M25561 Pain in right knee: Secondary | ICD-10-CM | POA: Diagnosis not present

## 2019-12-01 DIAGNOSIS — M722 Plantar fascial fibromatosis: Secondary | ICD-10-CM | POA: Diagnosis not present

## 2019-12-01 DIAGNOSIS — M25473 Effusion, unspecified ankle: Secondary | ICD-10-CM | POA: Diagnosis not present

## 2019-12-01 DIAGNOSIS — M25673 Stiffness of unspecified ankle, not elsewhere classified: Secondary | ICD-10-CM | POA: Diagnosis not present

## 2019-12-01 DIAGNOSIS — M6281 Muscle weakness (generalized): Secondary | ICD-10-CM | POA: Diagnosis not present

## 2019-12-01 DIAGNOSIS — M797 Fibromyalgia: Secondary | ICD-10-CM | POA: Diagnosis not present

## 2019-12-01 DIAGNOSIS — M25562 Pain in left knee: Secondary | ICD-10-CM | POA: Diagnosis not present

## 2019-12-04 DIAGNOSIS — R2681 Unsteadiness on feet: Secondary | ICD-10-CM | POA: Diagnosis not present

## 2019-12-04 DIAGNOSIS — M25562 Pain in left knee: Secondary | ICD-10-CM | POA: Diagnosis not present

## 2019-12-04 DIAGNOSIS — M25561 Pain in right knee: Secondary | ICD-10-CM | POA: Diagnosis not present

## 2019-12-04 DIAGNOSIS — M6281 Muscle weakness (generalized): Secondary | ICD-10-CM | POA: Diagnosis not present

## 2019-12-04 DIAGNOSIS — M797 Fibromyalgia: Secondary | ICD-10-CM | POA: Diagnosis not present

## 2019-12-04 DIAGNOSIS — M25473 Effusion, unspecified ankle: Secondary | ICD-10-CM | POA: Diagnosis not present

## 2019-12-04 DIAGNOSIS — M722 Plantar fascial fibromatosis: Secondary | ICD-10-CM | POA: Diagnosis not present

## 2019-12-04 DIAGNOSIS — M25673 Stiffness of unspecified ankle, not elsewhere classified: Secondary | ICD-10-CM | POA: Diagnosis not present

## 2019-12-05 DIAGNOSIS — Z23 Encounter for immunization: Secondary | ICD-10-CM | POA: Diagnosis not present

## 2019-12-08 DIAGNOSIS — M25561 Pain in right knee: Secondary | ICD-10-CM | POA: Diagnosis not present

## 2019-12-08 DIAGNOSIS — M25673 Stiffness of unspecified ankle, not elsewhere classified: Secondary | ICD-10-CM | POA: Diagnosis not present

## 2019-12-08 DIAGNOSIS — M797 Fibromyalgia: Secondary | ICD-10-CM | POA: Diagnosis not present

## 2019-12-08 DIAGNOSIS — R2681 Unsteadiness on feet: Secondary | ICD-10-CM | POA: Diagnosis not present

## 2019-12-08 DIAGNOSIS — M722 Plantar fascial fibromatosis: Secondary | ICD-10-CM | POA: Diagnosis not present

## 2019-12-08 DIAGNOSIS — M6281 Muscle weakness (generalized): Secondary | ICD-10-CM | POA: Diagnosis not present

## 2019-12-08 DIAGNOSIS — M25562 Pain in left knee: Secondary | ICD-10-CM | POA: Diagnosis not present

## 2019-12-08 DIAGNOSIS — M25473 Effusion, unspecified ankle: Secondary | ICD-10-CM | POA: Diagnosis not present

## 2019-12-11 DIAGNOSIS — M722 Plantar fascial fibromatosis: Secondary | ICD-10-CM | POA: Diagnosis not present

## 2019-12-11 DIAGNOSIS — M25562 Pain in left knee: Secondary | ICD-10-CM | POA: Diagnosis not present

## 2019-12-11 DIAGNOSIS — R2681 Unsteadiness on feet: Secondary | ICD-10-CM | POA: Diagnosis not present

## 2019-12-11 DIAGNOSIS — M797 Fibromyalgia: Secondary | ICD-10-CM | POA: Diagnosis not present

## 2019-12-11 DIAGNOSIS — M25673 Stiffness of unspecified ankle, not elsewhere classified: Secondary | ICD-10-CM | POA: Diagnosis not present

## 2019-12-11 DIAGNOSIS — M25561 Pain in right knee: Secondary | ICD-10-CM | POA: Diagnosis not present

## 2019-12-11 DIAGNOSIS — M6281 Muscle weakness (generalized): Secondary | ICD-10-CM | POA: Diagnosis not present

## 2019-12-11 DIAGNOSIS — M25473 Effusion, unspecified ankle: Secondary | ICD-10-CM | POA: Diagnosis not present

## 2019-12-13 DIAGNOSIS — M25473 Effusion, unspecified ankle: Secondary | ICD-10-CM | POA: Diagnosis not present

## 2019-12-13 DIAGNOSIS — M25561 Pain in right knee: Secondary | ICD-10-CM | POA: Diagnosis not present

## 2019-12-13 DIAGNOSIS — R2681 Unsteadiness on feet: Secondary | ICD-10-CM | POA: Diagnosis not present

## 2019-12-13 DIAGNOSIS — M797 Fibromyalgia: Secondary | ICD-10-CM | POA: Diagnosis not present

## 2019-12-13 DIAGNOSIS — M25673 Stiffness of unspecified ankle, not elsewhere classified: Secondary | ICD-10-CM | POA: Diagnosis not present

## 2019-12-13 DIAGNOSIS — M722 Plantar fascial fibromatosis: Secondary | ICD-10-CM | POA: Diagnosis not present

## 2019-12-13 DIAGNOSIS — M6281 Muscle weakness (generalized): Secondary | ICD-10-CM | POA: Diagnosis not present

## 2019-12-13 DIAGNOSIS — M25562 Pain in left knee: Secondary | ICD-10-CM | POA: Diagnosis not present

## 2019-12-15 DIAGNOSIS — M25473 Effusion, unspecified ankle: Secondary | ICD-10-CM | POA: Diagnosis not present

## 2019-12-15 DIAGNOSIS — M797 Fibromyalgia: Secondary | ICD-10-CM | POA: Diagnosis not present

## 2019-12-15 DIAGNOSIS — M6281 Muscle weakness (generalized): Secondary | ICD-10-CM | POA: Diagnosis not present

## 2019-12-15 DIAGNOSIS — R2681 Unsteadiness on feet: Secondary | ICD-10-CM | POA: Diagnosis not present

## 2019-12-15 DIAGNOSIS — M722 Plantar fascial fibromatosis: Secondary | ICD-10-CM | POA: Diagnosis not present

## 2019-12-15 DIAGNOSIS — M25561 Pain in right knee: Secondary | ICD-10-CM | POA: Diagnosis not present

## 2019-12-15 DIAGNOSIS — M25673 Stiffness of unspecified ankle, not elsewhere classified: Secondary | ICD-10-CM | POA: Diagnosis not present

## 2019-12-15 DIAGNOSIS — M25562 Pain in left knee: Secondary | ICD-10-CM | POA: Diagnosis not present

## 2019-12-18 DIAGNOSIS — M6281 Muscle weakness (generalized): Secondary | ICD-10-CM | POA: Diagnosis not present

## 2019-12-18 DIAGNOSIS — R2681 Unsteadiness on feet: Secondary | ICD-10-CM | POA: Diagnosis not present

## 2019-12-18 DIAGNOSIS — M25561 Pain in right knee: Secondary | ICD-10-CM | POA: Diagnosis not present

## 2019-12-18 DIAGNOSIS — M797 Fibromyalgia: Secondary | ICD-10-CM | POA: Diagnosis not present

## 2019-12-18 DIAGNOSIS — M722 Plantar fascial fibromatosis: Secondary | ICD-10-CM | POA: Diagnosis not present

## 2019-12-18 DIAGNOSIS — M25562 Pain in left knee: Secondary | ICD-10-CM | POA: Diagnosis not present

## 2019-12-18 DIAGNOSIS — M25473 Effusion, unspecified ankle: Secondary | ICD-10-CM | POA: Diagnosis not present

## 2019-12-18 DIAGNOSIS — M25673 Stiffness of unspecified ankle, not elsewhere classified: Secondary | ICD-10-CM | POA: Diagnosis not present

## 2019-12-21 DIAGNOSIS — M6248 Contracture of muscle, other site: Secondary | ICD-10-CM | POA: Diagnosis not present

## 2019-12-21 DIAGNOSIS — M549 Dorsalgia, unspecified: Secondary | ICD-10-CM | POA: Diagnosis not present

## 2019-12-21 DIAGNOSIS — M4726 Other spondylosis with radiculopathy, lumbar region: Secondary | ICD-10-CM | POA: Diagnosis not present

## 2020-01-04 DIAGNOSIS — B028 Zoster with other complications: Secondary | ICD-10-CM | POA: Diagnosis not present

## 2020-01-12 DIAGNOSIS — B029 Zoster without complications: Secondary | ICD-10-CM | POA: Diagnosis not present

## 2020-02-05 DIAGNOSIS — Z23 Encounter for immunization: Secondary | ICD-10-CM | POA: Diagnosis not present

## 2020-02-12 DIAGNOSIS — H9193 Unspecified hearing loss, bilateral: Secondary | ICD-10-CM | POA: Diagnosis not present

## 2020-02-12 DIAGNOSIS — M4726 Other spondylosis with radiculopathy, lumbar region: Secondary | ICD-10-CM | POA: Diagnosis not present

## 2020-02-12 DIAGNOSIS — Z23 Encounter for immunization: Secondary | ICD-10-CM | POA: Diagnosis not present

## 2020-02-12 DIAGNOSIS — M792 Neuralgia and neuritis, unspecified: Secondary | ICD-10-CM | POA: Diagnosis not present

## 2020-03-04 ENCOUNTER — Other Ambulatory Visit: Payer: Medicare Other

## 2020-04-02 DIAGNOSIS — Z23 Encounter for immunization: Secondary | ICD-10-CM | POA: Diagnosis not present

## 2020-04-02 DIAGNOSIS — E2839 Other primary ovarian failure: Secondary | ICD-10-CM | POA: Diagnosis not present

## 2020-04-02 DIAGNOSIS — E1140 Type 2 diabetes mellitus with diabetic neuropathy, unspecified: Secondary | ICD-10-CM | POA: Diagnosis not present

## 2020-04-02 DIAGNOSIS — E782 Mixed hyperlipidemia: Secondary | ICD-10-CM | POA: Diagnosis not present

## 2020-04-02 DIAGNOSIS — E038 Other specified hypothyroidism: Secondary | ICD-10-CM | POA: Diagnosis not present

## 2020-04-02 DIAGNOSIS — D692 Other nonthrombocytopenic purpura: Secondary | ICD-10-CM | POA: Diagnosis not present

## 2020-04-02 DIAGNOSIS — R202 Paresthesia of skin: Secondary | ICD-10-CM | POA: Diagnosis not present

## 2020-04-02 DIAGNOSIS — M48062 Spinal stenosis, lumbar region with neurogenic claudication: Secondary | ICD-10-CM | POA: Diagnosis not present

## 2020-04-02 DIAGNOSIS — Z79899 Other long term (current) drug therapy: Secondary | ICD-10-CM | POA: Diagnosis not present

## 2020-04-02 DIAGNOSIS — E1122 Type 2 diabetes mellitus with diabetic chronic kidney disease: Secondary | ICD-10-CM | POA: Diagnosis not present

## 2020-04-02 DIAGNOSIS — E559 Vitamin D deficiency, unspecified: Secondary | ICD-10-CM | POA: Diagnosis not present

## 2020-04-02 DIAGNOSIS — Z0000 Encounter for general adult medical examination without abnormal findings: Secondary | ICD-10-CM | POA: Diagnosis not present

## 2020-04-03 ENCOUNTER — Other Ambulatory Visit: Payer: Self-pay | Admitting: Family Medicine

## 2020-04-03 DIAGNOSIS — E2839 Other primary ovarian failure: Secondary | ICD-10-CM

## 2020-04-18 ENCOUNTER — Other Ambulatory Visit: Payer: Self-pay | Admitting: Family Medicine

## 2020-04-18 DIAGNOSIS — Z1231 Encounter for screening mammogram for malignant neoplasm of breast: Secondary | ICD-10-CM

## 2020-05-01 DIAGNOSIS — H903 Sensorineural hearing loss, bilateral: Secondary | ICD-10-CM | POA: Diagnosis not present

## 2020-05-01 DIAGNOSIS — Z77122 Contact with and (suspected) exposure to noise: Secondary | ICD-10-CM | POA: Diagnosis not present

## 2020-07-05 DIAGNOSIS — H0288B Meibomian gland dysfunction left eye, upper and lower eyelids: Secondary | ICD-10-CM | POA: Diagnosis not present

## 2020-07-05 DIAGNOSIS — H18513 Endothelial corneal dystrophy, bilateral: Secondary | ICD-10-CM | POA: Diagnosis not present

## 2020-07-05 DIAGNOSIS — H0288A Meibomian gland dysfunction right eye, upper and lower eyelids: Secondary | ICD-10-CM | POA: Diagnosis not present

## 2020-07-05 DIAGNOSIS — Z961 Presence of intraocular lens: Secondary | ICD-10-CM | POA: Diagnosis not present

## 2020-07-31 DIAGNOSIS — F3341 Major depressive disorder, recurrent, in partial remission: Secondary | ICD-10-CM | POA: Diagnosis not present

## 2020-07-31 DIAGNOSIS — I10 Essential (primary) hypertension: Secondary | ICD-10-CM | POA: Diagnosis not present

## 2020-07-31 DIAGNOSIS — E1122 Type 2 diabetes mellitus with diabetic chronic kidney disease: Secondary | ICD-10-CM | POA: Diagnosis not present

## 2020-07-31 DIAGNOSIS — M791 Myalgia, unspecified site: Secondary | ICD-10-CM | POA: Diagnosis not present

## 2020-09-03 ENCOUNTER — Ambulatory Visit
Admission: RE | Admit: 2020-09-03 | Discharge: 2020-09-03 | Disposition: A | Discharge status: Home / Self Care | Payer: Medicare Other | Source: Ambulatory Visit | Attending: Family Medicine | Admitting: Family Medicine

## 2020-09-03 ENCOUNTER — Other Ambulatory Visit: Payer: Self-pay

## 2020-09-03 DIAGNOSIS — Z1231 Encounter for screening mammogram for malignant neoplasm of breast: Secondary | ICD-10-CM

## 2020-09-03 DIAGNOSIS — E2839 Other primary ovarian failure: Secondary | ICD-10-CM

## 2020-09-03 DIAGNOSIS — Z780 Asymptomatic menopausal state: Secondary | ICD-10-CM | POA: Diagnosis not present

## 2020-10-02 DIAGNOSIS — M25562 Pain in left knee: Secondary | ICD-10-CM | POA: Diagnosis not present

## 2020-10-09 DIAGNOSIS — M4726 Other spondylosis with radiculopathy, lumbar region: Secondary | ICD-10-CM | POA: Diagnosis not present

## 2020-10-09 DIAGNOSIS — M5416 Radiculopathy, lumbar region: Secondary | ICD-10-CM | POA: Diagnosis not present

## 2020-10-09 DIAGNOSIS — M5136 Other intervertebral disc degeneration, lumbar region: Secondary | ICD-10-CM | POA: Diagnosis not present

## 2020-10-09 DIAGNOSIS — M25569 Pain in unspecified knee: Secondary | ICD-10-CM | POA: Diagnosis not present

## 2020-10-15 DIAGNOSIS — M25562 Pain in left knee: Secondary | ICD-10-CM | POA: Diagnosis not present

## 2020-10-15 DIAGNOSIS — M25561 Pain in right knee: Secondary | ICD-10-CM | POA: Diagnosis not present

## 2020-10-25 DIAGNOSIS — M25562 Pain in left knee: Secondary | ICD-10-CM | POA: Diagnosis not present

## 2020-10-25 DIAGNOSIS — M25561 Pain in right knee: Secondary | ICD-10-CM | POA: Diagnosis not present

## 2020-11-01 DIAGNOSIS — M25562 Pain in left knee: Secondary | ICD-10-CM | POA: Diagnosis not present

## 2020-11-01 DIAGNOSIS — M25561 Pain in right knee: Secondary | ICD-10-CM | POA: Diagnosis not present

## 2020-11-08 DIAGNOSIS — M25562 Pain in left knee: Secondary | ICD-10-CM | POA: Diagnosis not present

## 2020-11-08 DIAGNOSIS — M25561 Pain in right knee: Secondary | ICD-10-CM | POA: Diagnosis not present

## 2020-11-14 DIAGNOSIS — Z96659 Presence of unspecified artificial knee joint: Secondary | ICD-10-CM | POA: Diagnosis not present

## 2020-11-29 ENCOUNTER — Other Ambulatory Visit: Payer: Self-pay

## 2020-11-29 ENCOUNTER — Ambulatory Visit (INDEPENDENT_AMBULATORY_CARE_PROVIDER_SITE_OTHER): Payer: Medicare Other

## 2020-11-29 ENCOUNTER — Ambulatory Visit (INDEPENDENT_AMBULATORY_CARE_PROVIDER_SITE_OTHER): Payer: Medicare Other | Admitting: Podiatry

## 2020-11-29 DIAGNOSIS — M7752 Other enthesopathy of left foot: Secondary | ICD-10-CM | POA: Diagnosis not present

## 2020-11-29 DIAGNOSIS — B351 Tinea unguium: Secondary | ICD-10-CM

## 2020-11-29 DIAGNOSIS — M25372 Other instability, left ankle: Secondary | ICD-10-CM

## 2020-11-29 DIAGNOSIS — M722 Plantar fascial fibromatosis: Secondary | ICD-10-CM | POA: Diagnosis not present

## 2020-11-29 DIAGNOSIS — M79675 Pain in left toe(s): Secondary | ICD-10-CM

## 2020-11-29 DIAGNOSIS — M79674 Pain in right toe(s): Secondary | ICD-10-CM

## 2020-12-05 ENCOUNTER — Other Ambulatory Visit: Payer: Self-pay | Admitting: Podiatry

## 2020-12-05 DIAGNOSIS — M25372 Other instability, left ankle: Secondary | ICD-10-CM

## 2020-12-05 DIAGNOSIS — M7752 Other enthesopathy of left foot: Secondary | ICD-10-CM

## 2020-12-05 NOTE — Progress Notes (Signed)
Subjective: 80 year old female presents to the office today originally for thick, elongated toenails that she cannot trim her self and they are causing discomfort.  She denies any ulcerations.  She states that she still getting pain to the top of her foot and some to the ankle. She wears a compression sock at home.  She is been doing physical therapy for her back.  She was going to physical therapy for her foot but had to stop this.  No recent injury to her feet.  No other concerns.  Objective: AAO x3, NAD DP/PT pulses palpable bilaterally, CRT less than 3 seconds.  Chronic appearing edema bilateral lower extremities. Nails are hypertrophic, dystrophic, brittle, discolored, elongated 10. No surrounding redness or drainage. Tenderness nails 1-5 bilaterally. No open lesions or pre-ulcerative lesions are identified today. There is modest Dorsal Aspect of the Midfoot and Also She Does Get Some Discomfort on the Plantar Aspect of the Calcaneus at the Insertion of Plantar Fascia.  There Is No Tenderness with Lateral Compression of the Calcaneus.  Flexor, Extensor Tendons Appear to Be Intact.  MMT 5/5. No pain with calf compression, warmth, erythema  Assessment: Plantar fasciitis, arthritis; symptomatic onychomycosis  Plan: -All treatment options discussed with the patient including all alternatives, risks, complications.  -Nails sharply debrided x10 without any complications or bleeding. -X-rays obtained reviewed.  No evidence of acute fracture.  Arthritic changes present midfoot. -Regards to her foot pain we discussed returning to physical therapy as well as wearing shoes and good arch support.  Recommend compression socks, elevation.  Return in about 3 months (around 03/01/2021).  Trula Slade DPM

## 2020-12-06 DIAGNOSIS — M25561 Pain in right knee: Secondary | ICD-10-CM | POA: Diagnosis not present

## 2020-12-06 DIAGNOSIS — M25562 Pain in left knee: Secondary | ICD-10-CM | POA: Diagnosis not present

## 2020-12-26 DIAGNOSIS — M25561 Pain in right knee: Secondary | ICD-10-CM | POA: Diagnosis not present

## 2020-12-26 DIAGNOSIS — M25562 Pain in left knee: Secondary | ICD-10-CM | POA: Diagnosis not present

## 2020-12-27 DIAGNOSIS — Z23 Encounter for immunization: Secondary | ICD-10-CM | POA: Diagnosis not present

## 2021-01-02 DIAGNOSIS — M25562 Pain in left knee: Secondary | ICD-10-CM | POA: Diagnosis not present

## 2021-01-02 DIAGNOSIS — M25561 Pain in right knee: Secondary | ICD-10-CM | POA: Diagnosis not present

## 2021-01-24 DIAGNOSIS — M25562 Pain in left knee: Secondary | ICD-10-CM | POA: Diagnosis not present

## 2021-01-24 DIAGNOSIS — M25561 Pain in right knee: Secondary | ICD-10-CM | POA: Diagnosis not present

## 2021-03-03 ENCOUNTER — Other Ambulatory Visit: Payer: Self-pay

## 2021-03-03 ENCOUNTER — Ambulatory Visit (INDEPENDENT_AMBULATORY_CARE_PROVIDER_SITE_OTHER): Payer: Medicare Other | Admitting: Podiatry

## 2021-03-03 DIAGNOSIS — B351 Tinea unguium: Secondary | ICD-10-CM

## 2021-03-03 DIAGNOSIS — M722 Plantar fascial fibromatosis: Secondary | ICD-10-CM | POA: Diagnosis not present

## 2021-03-03 DIAGNOSIS — M79674 Pain in right toe(s): Secondary | ICD-10-CM | POA: Diagnosis not present

## 2021-03-03 DIAGNOSIS — M79675 Pain in left toe(s): Secondary | ICD-10-CM

## 2021-03-03 NOTE — Patient Instructions (Signed)
You can try "theraworx" on cramping in the legs as needed You can use "tea tree oil" on the toenails to try and help or an over the counter medication called "fungi-nail".

## 2021-03-04 NOTE — Progress Notes (Signed)
Subjective: 81 year old female presents to the office today originally for thick, elongated toenails that she cannot trim her self and they are causing discomfort.  She denies any ulcerations.  She does get some discomfort in the arch of her foot.  The physical therapy that she was doing was helpful.  She describes tightness in her foot.  She states that she started wearing better shoes which has been helpful.  No recent injury or changes in the last saw her she has no other concerns today.  Objective: AAO x3, NAD DP/PT pulses palpable bilaterally, CRT less than 3 seconds.   Chronic appearing edema bilateral lower extremities. Nails are hypertrophic, dystrophic, brittle, discolored, elongated 10. No surrounding redness or drainage. Tenderness nails 1-5 bilaterally. No open lesions or pre-ulcerative lesions are identified today. Mild discomfort along the arch of the foot on the plantar fascial insertion of the calcaneus.  Not able to elicit any area of pinpoint tenderness.  Flexor, extensor tendons appear to be intact.  MMT 5/5. No pain with calf compression, warmth, erythema  Assessment: Plantar fasciitis, arthritis; symptomatic onychomycosis  Plan: -All treatment options discussed with the patient including all alternatives, risks, complications.  -Nails sharply debrided x10 without any complications or bleeding.  She was asked not oils for her toes we discussed tea tree oil, over-the-counter Fungi-Nail. -Regards to the plantar fascial we discussed continue stretching, icing daily.  Discussed using therawox as well as I think she is getting some cramping to her foot.  Also discussed with the tightness distraction will be helpful.  Continue with the shoes and good arch support. -Continue compression socks.  Return in about 3 months  Trula Slade DPM

## 2021-03-17 DIAGNOSIS — I358 Other nonrheumatic aortic valve disorders: Secondary | ICD-10-CM | POA: Diagnosis not present

## 2021-03-17 DIAGNOSIS — J3089 Other allergic rhinitis: Secondary | ICD-10-CM | POA: Diagnosis not present

## 2021-03-17 DIAGNOSIS — K649 Unspecified hemorrhoids: Secondary | ICD-10-CM | POA: Diagnosis not present

## 2021-03-17 DIAGNOSIS — D649 Anemia, unspecified: Secondary | ICD-10-CM | POA: Diagnosis not present

## 2021-03-21 ENCOUNTER — Other Ambulatory Visit (HOSPITAL_COMMUNITY): Payer: Self-pay | Admitting: Family Medicine

## 2021-03-21 DIAGNOSIS — I358 Other nonrheumatic aortic valve disorders: Secondary | ICD-10-CM

## 2021-03-25 ENCOUNTER — Ambulatory Visit (INDEPENDENT_AMBULATORY_CARE_PROVIDER_SITE_OTHER): Payer: Medicare Other | Admitting: Podiatry

## 2021-03-25 ENCOUNTER — Other Ambulatory Visit: Payer: Self-pay

## 2021-03-25 DIAGNOSIS — M79605 Pain in left leg: Secondary | ICD-10-CM

## 2021-03-25 DIAGNOSIS — M79674 Pain in right toe(s): Secondary | ICD-10-CM

## 2021-03-25 DIAGNOSIS — M79675 Pain in left toe(s): Secondary | ICD-10-CM

## 2021-03-25 DIAGNOSIS — M79604 Pain in right leg: Secondary | ICD-10-CM | POA: Diagnosis not present

## 2021-03-25 DIAGNOSIS — B351 Tinea unguium: Secondary | ICD-10-CM

## 2021-03-25 MED ORDER — CICLOPIROX 8 % EX SOLN: Freq: Every day | CUTANEOUS | 4 refills | Status: DC

## 2021-03-27 ENCOUNTER — Other Ambulatory Visit: Payer: Self-pay

## 2021-03-27 ENCOUNTER — Ambulatory Visit (HOSPITAL_COMMUNITY)
Admission: RE | Admit: 2021-03-27 | Discharge: 2021-03-27 | Disposition: A | Discharge status: Home / Self Care | Payer: Medicare Other | Source: Ambulatory Visit | Attending: Family Medicine | Admitting: Family Medicine

## 2021-03-27 DIAGNOSIS — I10 Essential (primary) hypertension: Secondary | ICD-10-CM | POA: Diagnosis not present

## 2021-03-27 DIAGNOSIS — E119 Type 2 diabetes mellitus without complications: Secondary | ICD-10-CM | POA: Diagnosis not present

## 2021-03-27 DIAGNOSIS — E785 Hyperlipidemia, unspecified: Secondary | ICD-10-CM | POA: Insufficient documentation

## 2021-03-27 DIAGNOSIS — I358 Other nonrheumatic aortic valve disorders: Secondary | ICD-10-CM

## 2021-03-27 DIAGNOSIS — I700 Atherosclerosis of aorta: Secondary | ICD-10-CM | POA: Insufficient documentation

## 2021-03-27 LAB — ECHOCARDIOGRAM COMPLETE
AR max vel: 2.07 cm2
AV Area VTI: 2.02 cm2
AV Mean grad: 6.8 mmHg
AV Peak grad: 14 mmHg
Ao pk vel: 1.87 m/s
Area-P 1/2: 3.12 cm2
S' Lateral: 2.5 cm

## 2021-03-30 NOTE — Progress Notes (Signed)
Subjective: 81 year old female presents to the office today for follow-up evaluation of bilateral foot discomfort.  She states that she still feels a tight sensation into the arch of the left foot.  At times she does get cramps in her legs when she walks upon asking about this.  She is tried stretching she is finished physical therapy.  She is asking about nail fungus treatment again today.  No other concerns today.  Objective: AAO x3, NAD DP/PT pulses palpable bilaterally, CRT less than 3 seconds.   Sensation intact with Thornell Mule monofilament Chronic appearing edema bilateral lower extremities. There is still mild discomfort along the arch of the foot on the plantar fascial insertion of the calcaneus.  Not able to elicit any area of pinpoint tenderness.  Flexor, extensor tendons appear to be intact.  MMT 5/5.  Equinus present No pain with calf compression, warmth, erythema  Assessment: Plantar fasciitis, arthritis, onychomycosis  Plan: -All treatment options discussed with the patient including all alternatives, risks, complications.  -Given the cramping in her legs most we will order an ABI. -Discussed supportive shoe gear and continue with stretching, icing daily. -Continue Penlac for nail fungus  Return in about 9 weeks (around 05/27/2021).  Trula Slade DPM

## 2021-03-31 ENCOUNTER — Other Ambulatory Visit: Payer: Self-pay

## 2021-03-31 ENCOUNTER — Ambulatory Visit (HOSPITAL_COMMUNITY)
Admission: RE | Admit: 2021-03-31 | Discharge: 2021-03-31 | Disposition: A | Discharge status: Home / Self Care | Payer: Medicare Other | Source: Ambulatory Visit | Attending: Podiatry | Admitting: Podiatry

## 2021-03-31 DIAGNOSIS — M79604 Pain in right leg: Secondary | ICD-10-CM | POA: Diagnosis not present

## 2021-03-31 DIAGNOSIS — M79605 Pain in left leg: Secondary | ICD-10-CM | POA: Insufficient documentation

## 2021-04-04 ENCOUNTER — Encounter: Payer: Self-pay | Admitting: Podiatry

## 2021-05-27 ENCOUNTER — Ambulatory Visit (INDEPENDENT_AMBULATORY_CARE_PROVIDER_SITE_OTHER): Payer: Medicare Other | Admitting: Podiatry

## 2021-05-27 DIAGNOSIS — B351 Tinea unguium: Secondary | ICD-10-CM

## 2021-05-27 DIAGNOSIS — M79674 Pain in right toe(s): Secondary | ICD-10-CM | POA: Diagnosis not present

## 2021-05-27 DIAGNOSIS — M79675 Pain in left toe(s): Secondary | ICD-10-CM | POA: Diagnosis not present

## 2021-05-27 DIAGNOSIS — R2681 Unsteadiness on feet: Secondary | ICD-10-CM | POA: Diagnosis not present

## 2021-05-27 DIAGNOSIS — M722 Plantar fascial fibromatosis: Secondary | ICD-10-CM

## 2021-05-27 NOTE — Progress Notes (Signed)
Subjective: ?81 year old female presents to the office today originally for thick, elongated toenails that she cannot trim her self and they are causing discomfort.  Denies any new ulcerations or issues.  She said that she still gets a tightness in the arch of her foot she is having some balance issues.  She did have arterial studies performed.  No recent injury or falls otherwise.  No other concerns.  ? ?Objective: ?AAO x3, NAD ?DP/PT pulses palpable bilaterally, CRT less than 3 seconds.   ?Chronic appearing edema bilateral lower extremities. ?Nails are hypertrophic, dystrophic, brittle, discolored, elongated ?10. No surrounding redness or drainage. Tenderness nails 1-5 bilaterally. No open lesions or pre-ulcerative lesions are identified today. ?Mild discomfort along the arch of the foot on the plantar fascial insertion of the calcaneus.  Not able to elicit any area of pinpoint tenderness.  Flexor, extensor tendons appear to be intact.  MMT 5/5. ?No pain with calf compression, warmth, erythema ? ?Assessment: ?Plantar fasciitis, arthritis; symptomatic onychomycosis ? ?Plan: ?-All treatment options discussed with the patient including all alternatives, risks, complications.  ?-Nails sharply debrided x10 without any complications or bleeding.  Penlac. ?-Regards to the foot pain I referred her to physical therapy and a prescription for [facility] was written. ?-Arterial studies reviewed.  No significant arterial disease bilaterally.  TBI abnormal. ?-Continue compression socks. ? ?Return in about 3 months ? ?Trula Slade DPM ? ?

## 2021-05-28 DIAGNOSIS — E559 Vitamin D deficiency, unspecified: Secondary | ICD-10-CM | POA: Diagnosis not present

## 2021-05-28 DIAGNOSIS — E038 Other specified hypothyroidism: Secondary | ICD-10-CM | POA: Diagnosis not present

## 2021-05-28 DIAGNOSIS — I10 Essential (primary) hypertension: Secondary | ICD-10-CM | POA: Diagnosis not present

## 2021-05-28 DIAGNOSIS — Z79899 Other long term (current) drug therapy: Secondary | ICD-10-CM | POA: Diagnosis not present

## 2021-05-28 DIAGNOSIS — E1122 Type 2 diabetes mellitus with diabetic chronic kidney disease: Secondary | ICD-10-CM | POA: Diagnosis not present

## 2021-05-28 DIAGNOSIS — Z0000 Encounter for general adult medical examination without abnormal findings: Secondary | ICD-10-CM | POA: Diagnosis not present

## 2021-05-28 DIAGNOSIS — I358 Other nonrheumatic aortic valve disorders: Secondary | ICD-10-CM | POA: Diagnosis not present

## 2021-05-28 DIAGNOSIS — I129 Hypertensive chronic kidney disease with stage 1 through stage 4 chronic kidney disease, or unspecified chronic kidney disease: Secondary | ICD-10-CM | POA: Diagnosis not present

## 2021-05-28 DIAGNOSIS — E7800 Pure hypercholesterolemia, unspecified: Secondary | ICD-10-CM | POA: Diagnosis not present

## 2021-05-28 DIAGNOSIS — D692 Other nonthrombocytopenic purpura: Secondary | ICD-10-CM | POA: Diagnosis not present

## 2021-05-28 DIAGNOSIS — E782 Mixed hyperlipidemia: Secondary | ICD-10-CM | POA: Diagnosis not present

## 2021-05-28 DIAGNOSIS — I351 Nonrheumatic aortic (valve) insufficiency: Secondary | ICD-10-CM | POA: Diagnosis not present

## 2021-05-30 DIAGNOSIS — M25675 Stiffness of left foot, not elsewhere classified: Secondary | ICD-10-CM | POA: Diagnosis not present

## 2021-05-30 DIAGNOSIS — M62572 Muscle wasting and atrophy, not elsewhere classified, left ankle and foot: Secondary | ICD-10-CM | POA: Diagnosis not present

## 2021-05-30 DIAGNOSIS — R269 Unspecified abnormalities of gait and mobility: Secondary | ICD-10-CM | POA: Diagnosis not present

## 2021-05-30 DIAGNOSIS — M25572 Pain in left ankle and joints of left foot: Secondary | ICD-10-CM | POA: Diagnosis not present

## 2021-06-06 DIAGNOSIS — M62572 Muscle wasting and atrophy, not elsewhere classified, left ankle and foot: Secondary | ICD-10-CM | POA: Diagnosis not present

## 2021-06-06 DIAGNOSIS — M25675 Stiffness of left foot, not elsewhere classified: Secondary | ICD-10-CM | POA: Diagnosis not present

## 2021-06-06 DIAGNOSIS — R269 Unspecified abnormalities of gait and mobility: Secondary | ICD-10-CM | POA: Diagnosis not present

## 2021-06-06 DIAGNOSIS — M25572 Pain in left ankle and joints of left foot: Secondary | ICD-10-CM | POA: Diagnosis not present

## 2021-06-10 DIAGNOSIS — M25675 Stiffness of left foot, not elsewhere classified: Secondary | ICD-10-CM | POA: Diagnosis not present

## 2021-06-10 DIAGNOSIS — R269 Unspecified abnormalities of gait and mobility: Secondary | ICD-10-CM | POA: Diagnosis not present

## 2021-06-10 DIAGNOSIS — M62572 Muscle wasting and atrophy, not elsewhere classified, left ankle and foot: Secondary | ICD-10-CM | POA: Diagnosis not present

## 2021-06-10 DIAGNOSIS — M25572 Pain in left ankle and joints of left foot: Secondary | ICD-10-CM | POA: Diagnosis not present

## 2021-06-13 DIAGNOSIS — R269 Unspecified abnormalities of gait and mobility: Secondary | ICD-10-CM | POA: Diagnosis not present

## 2021-06-13 DIAGNOSIS — M25675 Stiffness of left foot, not elsewhere classified: Secondary | ICD-10-CM | POA: Diagnosis not present

## 2021-06-13 DIAGNOSIS — M25572 Pain in left ankle and joints of left foot: Secondary | ICD-10-CM | POA: Diagnosis not present

## 2021-06-13 DIAGNOSIS — M62572 Muscle wasting and atrophy, not elsewhere classified, left ankle and foot: Secondary | ICD-10-CM | POA: Diagnosis not present

## 2021-06-24 DIAGNOSIS — R269 Unspecified abnormalities of gait and mobility: Secondary | ICD-10-CM | POA: Diagnosis not present

## 2021-06-24 DIAGNOSIS — M62572 Muscle wasting and atrophy, not elsewhere classified, left ankle and foot: Secondary | ICD-10-CM | POA: Diagnosis not present

## 2021-06-24 DIAGNOSIS — M25675 Stiffness of left foot, not elsewhere classified: Secondary | ICD-10-CM | POA: Diagnosis not present

## 2021-06-24 DIAGNOSIS — M25572 Pain in left ankle and joints of left foot: Secondary | ICD-10-CM | POA: Diagnosis not present

## 2021-06-26 DIAGNOSIS — M25675 Stiffness of left foot, not elsewhere classified: Secondary | ICD-10-CM | POA: Diagnosis not present

## 2021-06-26 DIAGNOSIS — R269 Unspecified abnormalities of gait and mobility: Secondary | ICD-10-CM | POA: Diagnosis not present

## 2021-06-26 DIAGNOSIS — M25572 Pain in left ankle and joints of left foot: Secondary | ICD-10-CM | POA: Diagnosis not present

## 2021-06-26 DIAGNOSIS — M62572 Muscle wasting and atrophy, not elsewhere classified, left ankle and foot: Secondary | ICD-10-CM | POA: Diagnosis not present

## 2021-06-30 DIAGNOSIS — M62572 Muscle wasting and atrophy, not elsewhere classified, left ankle and foot: Secondary | ICD-10-CM | POA: Diagnosis not present

## 2021-06-30 DIAGNOSIS — R269 Unspecified abnormalities of gait and mobility: Secondary | ICD-10-CM | POA: Diagnosis not present

## 2021-06-30 DIAGNOSIS — M25675 Stiffness of left foot, not elsewhere classified: Secondary | ICD-10-CM | POA: Diagnosis not present

## 2021-06-30 DIAGNOSIS — M25572 Pain in left ankle and joints of left foot: Secondary | ICD-10-CM | POA: Diagnosis not present

## 2021-07-03 DIAGNOSIS — R269 Unspecified abnormalities of gait and mobility: Secondary | ICD-10-CM | POA: Diagnosis not present

## 2021-07-03 DIAGNOSIS — M25572 Pain in left ankle and joints of left foot: Secondary | ICD-10-CM | POA: Diagnosis not present

## 2021-07-03 DIAGNOSIS — M62572 Muscle wasting and atrophy, not elsewhere classified, left ankle and foot: Secondary | ICD-10-CM | POA: Diagnosis not present

## 2021-07-03 DIAGNOSIS — M25675 Stiffness of left foot, not elsewhere classified: Secondary | ICD-10-CM | POA: Diagnosis not present

## 2021-07-08 DIAGNOSIS — M62572 Muscle wasting and atrophy, not elsewhere classified, left ankle and foot: Secondary | ICD-10-CM | POA: Diagnosis not present

## 2021-07-08 DIAGNOSIS — M25572 Pain in left ankle and joints of left foot: Secondary | ICD-10-CM | POA: Diagnosis not present

## 2021-07-08 DIAGNOSIS — M25675 Stiffness of left foot, not elsewhere classified: Secondary | ICD-10-CM | POA: Diagnosis not present

## 2021-07-08 DIAGNOSIS — R269 Unspecified abnormalities of gait and mobility: Secondary | ICD-10-CM | POA: Diagnosis not present

## 2021-07-10 DIAGNOSIS — M25675 Stiffness of left foot, not elsewhere classified: Secondary | ICD-10-CM | POA: Diagnosis not present

## 2021-07-10 DIAGNOSIS — M62572 Muscle wasting and atrophy, not elsewhere classified, left ankle and foot: Secondary | ICD-10-CM | POA: Diagnosis not present

## 2021-07-10 DIAGNOSIS — M25572 Pain in left ankle and joints of left foot: Secondary | ICD-10-CM | POA: Diagnosis not present

## 2021-07-10 DIAGNOSIS — R269 Unspecified abnormalities of gait and mobility: Secondary | ICD-10-CM | POA: Diagnosis not present

## 2021-07-15 DIAGNOSIS — M25675 Stiffness of left foot, not elsewhere classified: Secondary | ICD-10-CM | POA: Diagnosis not present

## 2021-07-15 DIAGNOSIS — R269 Unspecified abnormalities of gait and mobility: Secondary | ICD-10-CM | POA: Diagnosis not present

## 2021-07-15 DIAGNOSIS — M62572 Muscle wasting and atrophy, not elsewhere classified, left ankle and foot: Secondary | ICD-10-CM | POA: Diagnosis not present

## 2021-07-15 DIAGNOSIS — M25572 Pain in left ankle and joints of left foot: Secondary | ICD-10-CM | POA: Diagnosis not present

## 2021-07-17 DIAGNOSIS — M25572 Pain in left ankle and joints of left foot: Secondary | ICD-10-CM | POA: Diagnosis not present

## 2021-07-17 DIAGNOSIS — M62572 Muscle wasting and atrophy, not elsewhere classified, left ankle and foot: Secondary | ICD-10-CM | POA: Diagnosis not present

## 2021-07-17 DIAGNOSIS — R269 Unspecified abnormalities of gait and mobility: Secondary | ICD-10-CM | POA: Diagnosis not present

## 2021-07-17 DIAGNOSIS — M25675 Stiffness of left foot, not elsewhere classified: Secondary | ICD-10-CM | POA: Diagnosis not present

## 2021-08-06 DIAGNOSIS — H0288B Meibomian gland dysfunction left eye, upper and lower eyelids: Secondary | ICD-10-CM | POA: Diagnosis not present

## 2021-08-06 DIAGNOSIS — H18513 Endothelial corneal dystrophy, bilateral: Secondary | ICD-10-CM | POA: Diagnosis not present

## 2021-08-06 DIAGNOSIS — Z961 Presence of intraocular lens: Secondary | ICD-10-CM | POA: Diagnosis not present

## 2021-08-06 DIAGNOSIS — H0288A Meibomian gland dysfunction right eye, upper and lower eyelids: Secondary | ICD-10-CM | POA: Diagnosis not present

## 2021-08-18 DIAGNOSIS — K219 Gastro-esophageal reflux disease without esophagitis: Secondary | ICD-10-CM | POA: Diagnosis not present

## 2021-08-18 DIAGNOSIS — M48061 Spinal stenosis, lumbar region without neurogenic claudication: Secondary | ICD-10-CM | POA: Diagnosis not present

## 2021-09-01 ENCOUNTER — Ambulatory Visit (INDEPENDENT_AMBULATORY_CARE_PROVIDER_SITE_OTHER): Payer: Medicare Other | Admitting: Podiatry

## 2021-09-01 DIAGNOSIS — M79675 Pain in left toe(s): Secondary | ICD-10-CM

## 2021-09-01 DIAGNOSIS — M722 Plantar fascial fibromatosis: Secondary | ICD-10-CM

## 2021-09-01 DIAGNOSIS — B351 Tinea unguium: Secondary | ICD-10-CM

## 2021-09-01 DIAGNOSIS — M79674 Pain in right toe(s): Secondary | ICD-10-CM

## 2021-09-01 MED ORDER — TRIAMCINOLONE ACETONIDE 10 MG/ML IJ SUSP
10.0000 mg | Freq: Once | INTRAMUSCULAR | Status: AC
  Administered 2021-09-01: 10 mg

## 2021-09-01 NOTE — Patient Instructions (Signed)

## 2021-09-02 NOTE — Progress Notes (Signed)
Subjective: 81 year old female presents to the office today originally for thick, elongated toenails that she cannot trim her self and they are causing discomfort.  Denies any new ulcerations or issues.  She did finish physical therapy.  She is to do home stretching exercises.  She still gets some discomfort about the heel as well as some tightness that she reports the ball of the foot.  No recent injury or changes otherwise.    Objective: AAO x3, NAD DP/PT pulses palpable bilaterally, CRT less than 3 seconds.   Chronic appearing edema bilateral lower extremities. Nails are hypertrophic, dystrophic, brittle, discolored, elongated 10. No surrounding redness or drainage. Tenderness nails 1-5 bilaterally. No open lesions or pre-ulcerative lesions are identified today. There is still discomfort along the arch of the foot on the plantar fascial insertion of the calcaneus.  Not able to elicit any area of pinpoint tenderness.  Flexor, extensor tendons appear to be intact.  MMT 5/5. No pain with calf compression, warmth, erythema  Assessment: Plantar fasciitis, arthritis; symptomatic onychomycosis  Plan: -All treatment options discussed with the patient including all alternatives, risks, complications.  -Nails sharply debrided x10 without any complications or bleeding.  Penlac. -Continue home stretching, rehab exercises as well as shoes and good arch support.  She wanted proceed with a steroid injection today.  See procedure note below. -Continue compression socks.  Procedure: Injection Tendon/Ligament Discussed alternatives, risks, complications and verbal consent was obtained.  Location: Left plantar fascia at the glabrous junction; medial approach. Skin Prep: Alcohol. Injectate: 0.5cc 0.5% marcaine plain, 0.5 cc 2% lidocaine plain and, 1 cc kenalog 10. Disposition: Patient tolerated procedure well. Injection site dressed with a band-aid.  Post-injection care was discussed and return precautions  discussed.   Trula Slade DPM

## 2021-09-17 DIAGNOSIS — M5416 Radiculopathy, lumbar region: Secondary | ICD-10-CM | POA: Diagnosis not present

## 2021-10-16 DIAGNOSIS — M5416 Radiculopathy, lumbar region: Secondary | ICD-10-CM | POA: Diagnosis not present

## 2021-11-07 LAB — GLUCOSE, POCT (MANUAL RESULT ENTRY): POC Glucose: 137 mg/dl — AB (ref 70–99)

## 2021-11-12 DIAGNOSIS — M545 Low back pain, unspecified: Secondary | ICD-10-CM | POA: Insufficient documentation

## 2021-11-25 DIAGNOSIS — M5416 Radiculopathy, lumbar region: Secondary | ICD-10-CM | POA: Diagnosis not present

## 2021-11-25 DIAGNOSIS — M5459 Other low back pain: Secondary | ICD-10-CM | POA: Diagnosis not present

## 2021-12-02 ENCOUNTER — Ambulatory Visit: Payer: Medicare Other | Admitting: Podiatry

## 2021-12-03 ENCOUNTER — Other Ambulatory Visit: Payer: Self-pay | Admitting: Family Medicine

## 2021-12-03 ENCOUNTER — Telehealth: Payer: Self-pay | Admitting: Podiatry

## 2021-12-03 DIAGNOSIS — Z1231 Encounter for screening mammogram for malignant neoplasm of breast: Secondary | ICD-10-CM

## 2021-12-03 NOTE — Telephone Encounter (Signed)
Pts daughter called because pt missed her appt yesterday that they did no have on the calendar. I did reschedule them but she asked if she would be charged and I told her it would be up to the doctor. But if it was the first time they usually will not bill.

## 2021-12-19 DIAGNOSIS — M5136 Other intervertebral disc degeneration, lumbar region: Secondary | ICD-10-CM | POA: Diagnosis not present

## 2021-12-19 DIAGNOSIS — M5416 Radiculopathy, lumbar region: Secondary | ICD-10-CM | POA: Diagnosis not present

## 2021-12-25 DIAGNOSIS — M5416 Radiculopathy, lumbar region: Secondary | ICD-10-CM | POA: Diagnosis not present

## 2021-12-26 DIAGNOSIS — K219 Gastro-esophageal reflux disease without esophagitis: Secondary | ICD-10-CM | POA: Diagnosis not present

## 2021-12-26 DIAGNOSIS — M25519 Pain in unspecified shoulder: Secondary | ICD-10-CM | POA: Diagnosis not present

## 2021-12-26 DIAGNOSIS — R109 Unspecified abdominal pain: Secondary | ICD-10-CM | POA: Diagnosis not present

## 2021-12-29 ENCOUNTER — Ambulatory Visit (INDEPENDENT_AMBULATORY_CARE_PROVIDER_SITE_OTHER): Payer: Medicare Other | Admitting: Podiatry

## 2021-12-29 DIAGNOSIS — M79675 Pain in left toe(s): Secondary | ICD-10-CM | POA: Diagnosis not present

## 2021-12-29 DIAGNOSIS — M79674 Pain in right toe(s): Secondary | ICD-10-CM

## 2021-12-29 DIAGNOSIS — B351 Tinea unguium: Secondary | ICD-10-CM | POA: Diagnosis not present

## 2021-12-29 DIAGNOSIS — M722 Plantar fascial fibromatosis: Secondary | ICD-10-CM | POA: Diagnosis not present

## 2021-12-29 NOTE — Patient Instructions (Signed)
You can also freeze a water bottle and roll it in the heel/arch of your foot.   Plantar Fasciitis (Heel Spur Syndrome) with Rehab The plantar fascia is a fibrous, ligament-like, soft-tissue structure that spans the bottom of the foot. Plantar fasciitis is a condition that causes pain in the foot due to inflammation of the tissue. SYMPTOMS  Pain and tenderness on the underneath side of the foot. Pain that worsens with standing or walking. CAUSES  Plantar fasciitis is caused by irritation and injury to the plantar fascia on the underneath side of the foot. Common mechanisms of injury include: Direct trauma to bottom of the foot. Damage to a small nerve that runs under the foot where the main fascia attaches to the heel bone. Stress placed on the plantar fascia due to bone spurs. RISK INCREASES WITH:  Activities that place stress on the plantar fascia (running, jumping, pivoting, or cutting). Poor strength and flexibility. Improperly fitted shoes. Tight calf muscles. Flat feet. Failure to warm-up properly before activity. Obesity. PREVENTION Warm up and stretch properly before activity. Allow for adequate recovery between workouts. Maintain physical fitness: Strength, flexibility, and endurance. Cardiovascular fitness. Maintain a health body weight. Avoid stress on the plantar fascia. Wear properly fitted shoes, including arch supports for individuals who have flat feet.  PROGNOSIS  If treated properly, then the symptoms of plantar fasciitis usually resolve without surgery. However, occasionally surgery is necessary.  RELATED COMPLICATIONS  Recurrent symptoms that may result in a chronic condition. Problems of the lower back that are caused by compensating for the injury, such as limping. Pain or weakness of the foot during push-off following surgery. Chronic inflammation, scarring, and partial or complete fascia tear, occurring more often from repeated injections.  TREATMENT   Treatment initially involves the use of ice and medication to help reduce pain and inflammation. The use of strengthening and stretching exercises may help reduce pain with activity, especially stretches of the Achilles tendon. These exercises may be performed at home or with a therapist. Your caregiver may recommend that you use heel cups of arch supports to help reduce stress on the plantar fascia. Occasionally, corticosteroid injections are given to reduce inflammation. If symptoms persist for greater than 6 months despite non-surgical (conservative), then surgery may be recommended.   MEDICATION  If pain medication is necessary, then nonsteroidal anti-inflammatory medications, such as aspirin and ibuprofen, or other minor pain relievers, such as acetaminophen, are often recommended. Do not take pain medication within 7 days before surgery. Prescription pain relievers may be given if deemed necessary by your caregiver. Use only as directed and only as much as you need. Corticosteroid injections may be given by your caregiver. These injections should be reserved for the most serious cases, because they may only be given a certain number of times.  HEAT AND COLD Cold treatment (icing) relieves pain and reduces inflammation. Cold treatment should be applied for 10 to 15 minutes every 2 to 3 hours for inflammation and pain and immediately after any activity that aggravates your symptoms. Use ice packs or massage the area with a piece of ice (ice massage). Heat treatment may be used prior to performing the stretching and strengthening activities prescribed by your caregiver, physical therapist, or athletic trainer. Use a heat pack or soak the injury in warm water.  SEEK IMMEDIATE MEDICAL CARE IF: Treatment seems to offer no benefit, or the condition worsens. Any medications produce adverse side effects.  EXERCISES- RANGE OF MOTION (ROM) AND STRETCHING EXERCISES -  Plantar Fasciitis (Heel Spur  Syndrome) These exercises may help you when beginning to rehabilitate your injury. Your symptoms may resolve with or without further involvement from your physician, physical therapist or athletic trainer. While completing these exercises, remember:  Restoring tissue flexibility helps normal motion to return to the joints. This allows healthier, less painful movement and activity. An effective stretch should be held for at least 30 seconds. A stretch should never be painful. You should only feel a gentle lengthening or release in the stretched tissue.  RANGE OF MOTION - Toe Extension, Flexion Sit with your right / left leg crossed over your opposite knee. Grasp your toes and gently pull them back toward the top of your foot. You should feel a stretch on the bottom of your toes and/or foot. Hold this stretch for 10 seconds. Now, gently pull your toes toward the bottom of your foot. You should feel a stretch on the top of your toes and or foot. Hold this stretch for 10 seconds. Repeat  times. Complete this stretch 3 times per day.   RANGE OF MOTION - Ankle Dorsiflexion, Active Assisted Remove shoes and sit on a chair that is preferably not on a carpeted surface. Place right / left foot under knee. Extend your opposite leg for support. Keeping your heel down, slide your right / left foot back toward the chair until you feel a stretch at your ankle or calf. If you do not feel a stretch, slide your bottom forward to the edge of the chair, while still keeping your heel down. Hold this stretch for 10 seconds. Repeat 3 times. Complete this stretch 2 times per day.   STRETCH  Gastroc, Standing Place hands on wall. Extend right / left leg, keeping the front knee somewhat bent. Slightly point your toes inward on your back foot. Keeping your right / left heel on the floor and your knee straight, shift your weight toward the wall, not allowing your back to arch. You should feel a gentle stretch in the  right / left calf. Hold this position for 10 seconds. Repeat 3 times. Complete this stretch 2 times per day.  STRETCH  Soleus, Standing Place hands on wall. Extend right / left leg, keeping the other knee somewhat bent. Slightly point your toes inward on your back foot. Keep your right / left heel on the floor, bend your back knee, and slightly shift your weight over the back leg so that you feel a gentle stretch deep in your back calf. Hold this position for 10 seconds. Repeat 3 times. Complete this stretch 2 times per day.  STRETCH  Gastrocsoleus, Standing  Note: This exercise can place a lot of stress on your foot and ankle. Please complete this exercise only if specifically instructed by your caregiver.  Place the ball of your right / left foot on a step, keeping your other foot firmly on the same step. Hold on to the wall or a rail for balance. Slowly lift your other foot, allowing your body weight to press your heel down over the edge of the step. You should feel a stretch in your right / left calf. Hold this position for 10 seconds. Repeat this exercise with a slight bend in your right / left knee. Repeat 3 times. Complete this stretch 2 times per day.   STRENGTHENING EXERCISES - Plantar Fasciitis (Heel Spur Syndrome)  These exercises may help you when beginning to rehabilitate your injury. They may resolve your symptoms with or without  further involvement from your physician, physical therapist or athletic trainer. While completing these exercises, remember:  Muscles can gain both the endurance and the strength needed for everyday activities through controlled exercises. Complete these exercises as instructed by your physician, physical therapist or athletic trainer. Progress the resistance and repetitions only as guided.  STRENGTH - Towel Curls Sit in a chair positioned on a non-carpeted surface. Place your foot on a towel, keeping your heel on the floor. Pull the towel toward  your heel by only curling your toes. Keep your heel on the floor. Repeat 3 times. Complete this exercise 2 times per day.  STRENGTH - Ankle Inversion Secure one end of a rubber exercise band/tubing to a fixed object (table, pole). Loop the other end around your foot just before your toes. Place your fists between your knees. This will focus your strengthening at your ankle. Slowly, pull your big toe up and in, making sure the band/tubing is positioned to resist the entire motion. Hold this position for 10 seconds. Have your muscles resist the band/tubing as it slowly pulls your foot back to the starting position. Repeat 3 times. Complete this exercises 2 times per day.  Document Released: 02/09/2005 Document Revised: 05/04/2011 Document Reviewed: 05/24/2008 [facility] Patient Information 2014 [place], [place].

## 2021-12-30 ENCOUNTER — Other Ambulatory Visit: Payer: Self-pay | Admitting: Family Medicine

## 2021-12-30 DIAGNOSIS — R109 Unspecified abdominal pain: Secondary | ICD-10-CM

## 2021-12-31 DIAGNOSIS — Z23 Encounter for immunization: Secondary | ICD-10-CM | POA: Diagnosis not present

## 2022-01-05 NOTE — Progress Notes (Signed)
Subjective: Chief Complaint  Patient presents with   Plantar Fasciitis    Left foot pain, routine foot care, nail trim     81 year old female presents to the office today for thick, elongated toenails that she cannot trim her self and they are causing discomfort.  She does not report any ulcerations.  No recent injuries or changes.  She still gets some discomfort at the heel.    Objective: AAO x3, NAD DP/PT pulses palpable bilaterally, CRT less than 3 seconds.   Chronic appearing edema bilateral lower extremities. Nails are hypertrophic, dystrophic, brittle, discolored, elongated 10. No surrounding redness or drainage. Tenderness nails 1-5 bilaterally. No open lesions or pre-ulcerative lesions are identified today. Tenderness palpation along the insertion of plantar fascia but there is no area pinpoint tenderness.  No edema, erythema.  Flexor, extensor tendons appear to be intact.  MMT 5/5. No pain with calf compression, warmth, erythema  Assessment: Plantar fasciitis, symptomatic onychomycosis  Plan: -All treatment options discussed with the patient including all alternatives, risks, complications.  -Nails sharply debrided x10 without any complications or bleeding.  [place]. -Continue home stretching, rehab exercises as well as shoes and good arch support.  We held off on a sterile dressing today.  Consider formal physical therapy. -Continue compression socks.  Trula Slade DPM

## 2022-01-09 ENCOUNTER — Ambulatory Visit
Admission: RE | Admit: 2022-01-09 | Discharge: 2022-01-09 | Disposition: A | Discharge status: Home / Self Care | Payer: Medicare Other | Source: Ambulatory Visit | Attending: Family Medicine | Admitting: Family Medicine

## 2022-01-09 DIAGNOSIS — Z1231 Encounter for screening mammogram for malignant neoplasm of breast: Secondary | ICD-10-CM

## 2022-01-20 DIAGNOSIS — M25511 Pain in right shoulder: Secondary | ICD-10-CM | POA: Diagnosis not present

## 2022-01-26 DIAGNOSIS — M25511 Pain in right shoulder: Secondary | ICD-10-CM | POA: Diagnosis not present

## 2022-01-28 DIAGNOSIS — M25511 Pain in right shoulder: Secondary | ICD-10-CM | POA: Diagnosis not present

## 2022-02-02 DIAGNOSIS — M25511 Pain in right shoulder: Secondary | ICD-10-CM | POA: Diagnosis not present

## 2022-02-04 DIAGNOSIS — M25511 Pain in right shoulder: Secondary | ICD-10-CM | POA: Diagnosis not present

## 2022-02-09 DIAGNOSIS — M25511 Pain in right shoulder: Secondary | ICD-10-CM | POA: Diagnosis not present

## 2022-02-11 DIAGNOSIS — M25511 Pain in right shoulder: Secondary | ICD-10-CM | POA: Diagnosis not present

## 2022-03-06 DIAGNOSIS — Z96653 Presence of artificial knee joint, bilateral: Secondary | ICD-10-CM | POA: Diagnosis not present

## 2022-04-02 ENCOUNTER — Ambulatory Visit (INDEPENDENT_AMBULATORY_CARE_PROVIDER_SITE_OTHER): Payer: Medicare Other | Admitting: Podiatry

## 2022-04-02 VITALS — BP 144/86

## 2022-04-02 DIAGNOSIS — M79675 Pain in left toe(s): Secondary | ICD-10-CM | POA: Diagnosis not present

## 2022-04-02 DIAGNOSIS — M79674 Pain in right toe(s): Secondary | ICD-10-CM

## 2022-04-02 DIAGNOSIS — B351 Tinea unguium: Secondary | ICD-10-CM | POA: Diagnosis not present

## 2022-04-02 MED ORDER — CICLOPIROX 8 % EX SOLN: Freq: Every day | CUTANEOUS | 2 refills | Status: DC

## 2022-04-02 NOTE — Progress Notes (Signed)
Subjective: Chief Complaint  Patient presents with   foot care    [facility]    82 year old female presents to the office today for thick, elongated toenails that she cannot trim her self and they are causing discomfort.  She has been having "sciatic nerve" problems.   Objective: AAO x3, NAD DP/PT pulses palpable bilaterally, CRT less than 3 seconds.   Chronic appearing edema bilateral lower extremities. Nails are hypertrophic, dystrophic, brittle, discolored, elongated 10. No surrounding redness or drainage. Tenderness nails 1-5 bilaterally. No open lesions or pre-ulcerative lesions are identified today. No other areas of discomfort today. No pain with calf compression, warmth, erythema  Assessment: Plantar fasciitis, symptomatic onychomycosis  Plan: -All treatment options discussed with the patient including all alternatives, risks, complications.  -Nails sharply debrided x10 without any complications or bleeding.  [place]lac. -Continue compression socks. -Daily foot inspection  Trula Slade DPM

## 2022-04-06 ENCOUNTER — Telehealth: Payer: Self-pay

## 2022-04-06 NOTE — Telephone Encounter (Signed)
Prior Authorization was started for the ciclopirox [facility]) 8 % solution on 04/06/22.

## 2022-04-15 NOTE — Telephone Encounter (Signed)
Got it, thanks!

## 2022-04-15 NOTE — Telephone Encounter (Signed)
Medication denied. Appeal completed, please fax. Thanks.

## 2022-06-22 DIAGNOSIS — Z0000 Encounter for general adult medical examination without abnormal findings: Secondary | ICD-10-CM | POA: Diagnosis not present

## 2022-06-22 DIAGNOSIS — I351 Nonrheumatic aortic (valve) insufficiency: Secondary | ICD-10-CM | POA: Diagnosis not present

## 2022-06-22 DIAGNOSIS — I10 Essential (primary) hypertension: Secondary | ICD-10-CM | POA: Diagnosis not present

## 2022-06-22 DIAGNOSIS — Z79899 Other long term (current) drug therapy: Secondary | ICD-10-CM | POA: Diagnosis not present

## 2022-06-22 DIAGNOSIS — F3341 Major depressive disorder, recurrent, in partial remission: Secondary | ICD-10-CM | POA: Diagnosis not present

## 2022-06-22 DIAGNOSIS — D649 Anemia, unspecified: Secondary | ICD-10-CM | POA: Diagnosis not present

## 2022-06-22 DIAGNOSIS — E7800 Pure hypercholesterolemia, unspecified: Secondary | ICD-10-CM | POA: Diagnosis not present

## 2022-06-22 DIAGNOSIS — G629 Polyneuropathy, unspecified: Secondary | ICD-10-CM | POA: Diagnosis not present

## 2022-06-22 DIAGNOSIS — E559 Vitamin D deficiency, unspecified: Secondary | ICD-10-CM | POA: Diagnosis not present

## 2022-06-22 DIAGNOSIS — E1122 Type 2 diabetes mellitus with diabetic chronic kidney disease: Secondary | ICD-10-CM | POA: Diagnosis not present

## 2022-06-22 DIAGNOSIS — E038 Other specified hypothyroidism: Secondary | ICD-10-CM | POA: Diagnosis not present

## 2022-06-23 DIAGNOSIS — E1122 Type 2 diabetes mellitus with diabetic chronic kidney disease: Secondary | ICD-10-CM | POA: Diagnosis not present

## 2022-06-25 ENCOUNTER — Ambulatory Visit (INDEPENDENT_AMBULATORY_CARE_PROVIDER_SITE_OTHER): Payer: Medicare Other | Admitting: Podiatry

## 2022-06-25 DIAGNOSIS — M79675 Pain in left toe(s): Secondary | ICD-10-CM | POA: Diagnosis not present

## 2022-06-25 DIAGNOSIS — M79674 Pain in right toe(s): Secondary | ICD-10-CM | POA: Diagnosis not present

## 2022-06-25 DIAGNOSIS — B351 Tinea unguium: Secondary | ICD-10-CM | POA: Diagnosis not present

## 2022-06-25 MED ORDER — CICLOPIROX 8 % EX SOLN: Freq: Every day | CUTANEOUS | 0 refills | Status: AC

## 2022-06-25 NOTE — Progress Notes (Signed)
Subjective: Chief Complaint  Patient presents with   Nail Problem    Routine Foot Care     82 year old female presents to the office today for thick, elongated toenails that she cannot trim her self and they are causing discomfort.  She still has some sciatic nerve issues that she is being treated for.   Objective: AAO x3, NAD DP/PT pulses palpable bilaterally, CRT less than 3 seconds.   Chronic appearing edema bilateral lower extremities. Nails are hypertrophic, dystrophic, brittle, discolored, elongated 10. No surrounding redness or drainage. Tenderness nails 1-5 bilaterally. No open lesions or pre-ulcerative lesions are identified today. No other areas of discomfort today. No pain with calf compression, warmth, erythema  Assessment: Plantar fasciitis, symptomatic onychomycosis  Plan: -All treatment options discussed with the patient including all alternatives, risks, complications.  -Nails sharply debrided x10 without any complications or bleeding.  Refilled penlac. -Continue compression socks. -Daily foot inspection -Continue treatment for the sciatic nerve- will defer to her treating physician. We discussed stretching exercises for the feet/legs and wearing good, supportive shoes.   Vivi Barrack DPM

## 2022-09-24 ENCOUNTER — Ambulatory Visit: Payer: Medicare Other | Admitting: Podiatry

## 2022-10-06 DIAGNOSIS — N183 Chronic kidney disease, stage 3 unspecified: Secondary | ICD-10-CM | POA: Diagnosis not present

## 2022-10-07 ENCOUNTER — Other Ambulatory Visit: Payer: Self-pay | Admitting: Family Medicine

## 2022-10-07 DIAGNOSIS — N184 Chronic kidney disease, stage 4 (severe): Secondary | ICD-10-CM

## 2022-10-14 ENCOUNTER — Ambulatory Visit
Admission: RE | Admit: 2022-10-14 | Discharge: 2022-10-14 | Disposition: A | Discharge status: Home / Self Care | Payer: Medicare Other | Source: Ambulatory Visit | Attending: Family Medicine | Admitting: Family Medicine

## 2022-10-14 DIAGNOSIS — N184 Chronic kidney disease, stage 4 (severe): Secondary | ICD-10-CM

## 2022-10-19 ENCOUNTER — Ambulatory Visit: Payer: Medicare Other | Admitting: Podiatry

## 2022-10-19 IMAGING — MG MM DIGITAL SCREENING BILAT W/ TOMO AND CAD
8 series · 8 of 24 positions shown · non-contrast
Comparison: Previous exam(s).

CLINICAL DATA: Screening.

EXAM:
DIGITAL SCREENING BILATERAL MAMMOGRAM WITH TOMOSYNTHESIS AND CAD
TECHNIQUE: Bilateral screening digital craniocaudal and mediolateral oblique
mammograms were obtained. Bilateral screening digital breast
tomosynthesis was performed. The images were evaluated with
computer-aided detection.

[L CC synth-2D]
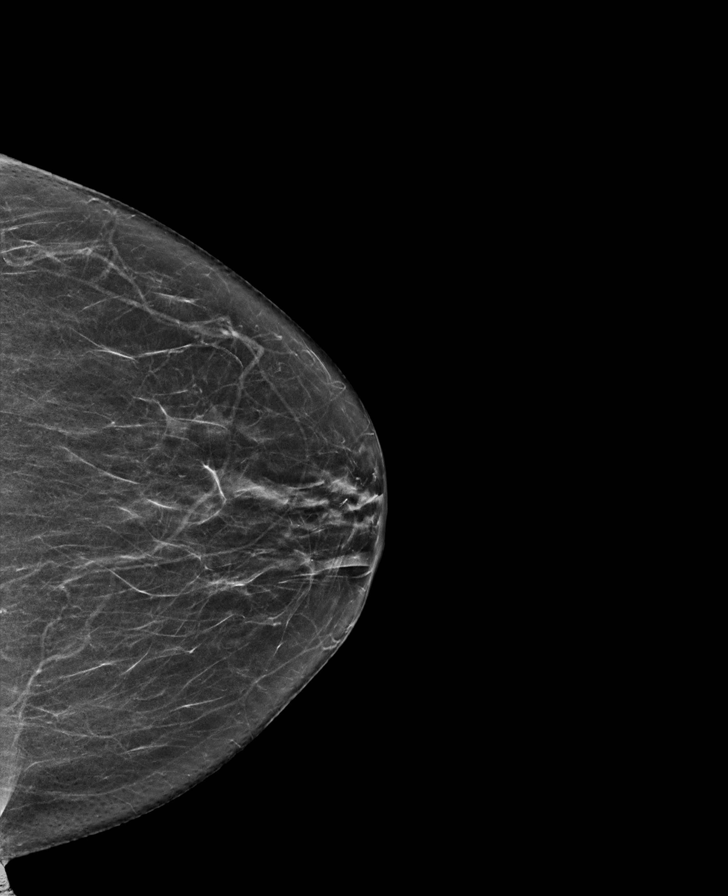

[L MLO synth-2D]
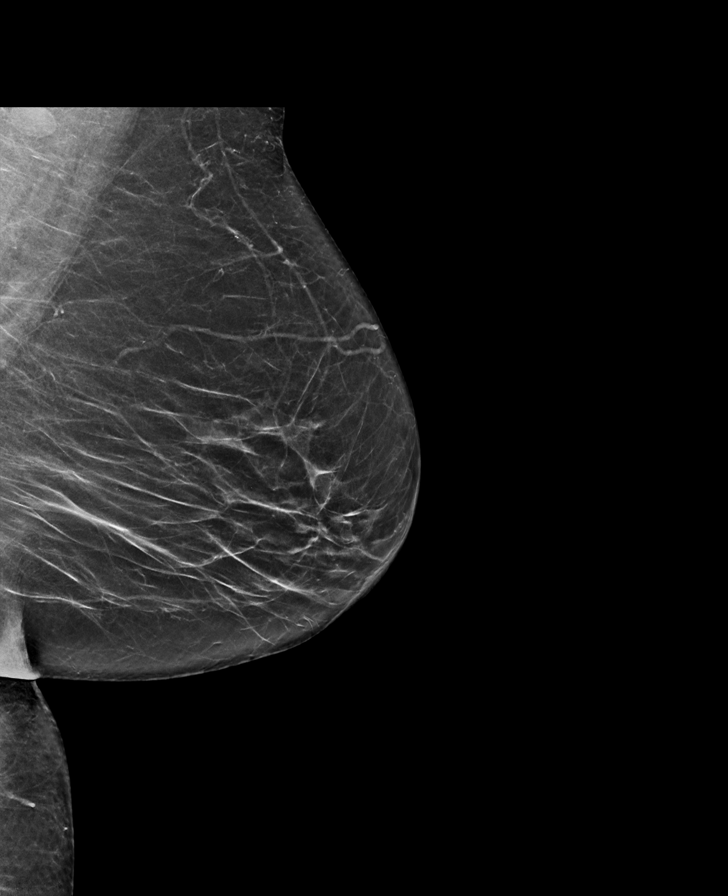

[R CC synth-2D]
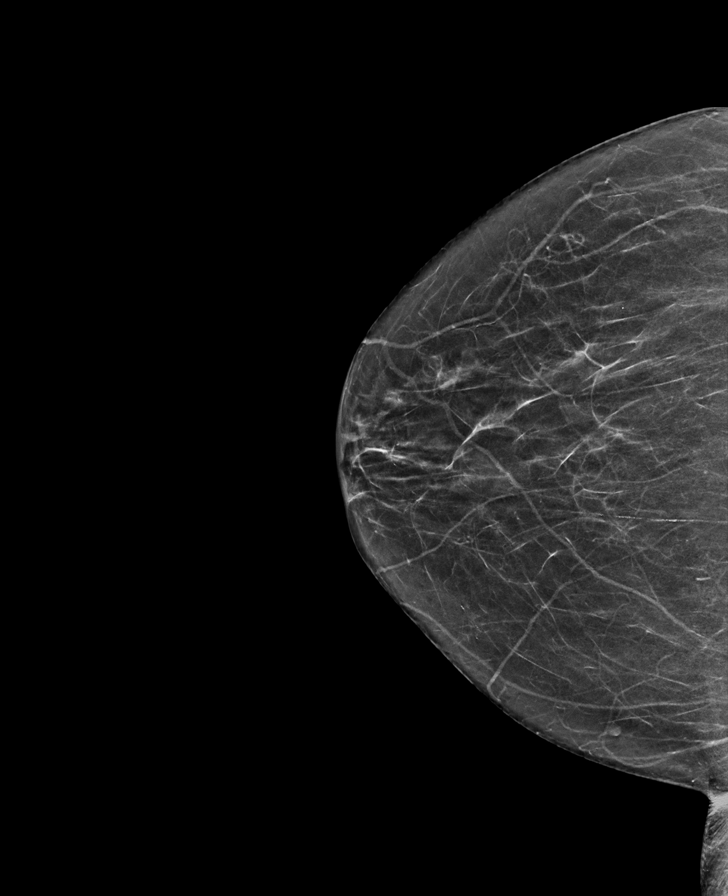

[R MLO synth-2D]
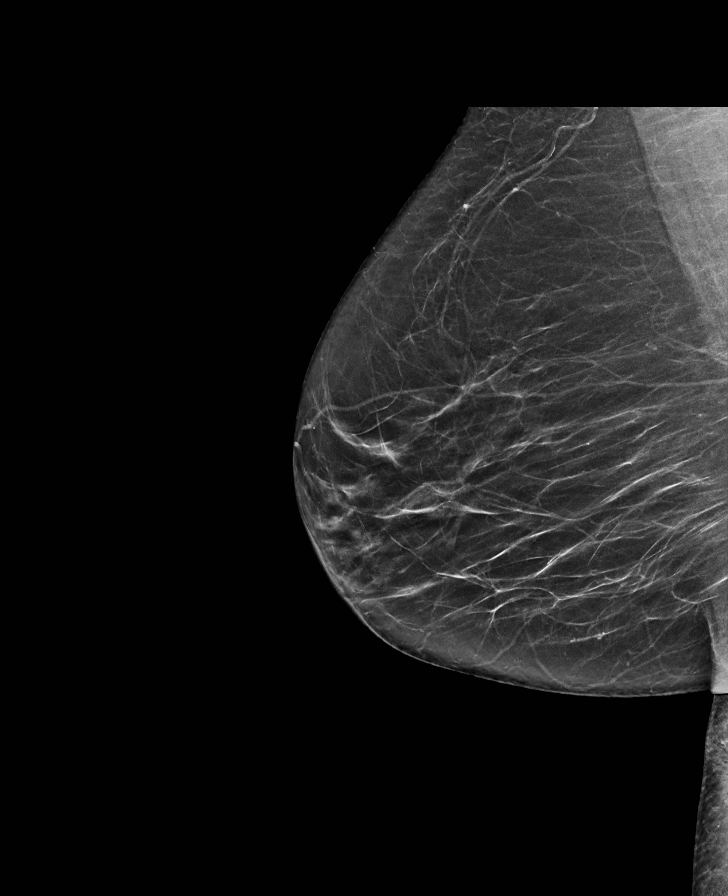

[R MLO tomo · tomo slice 31/62.0]
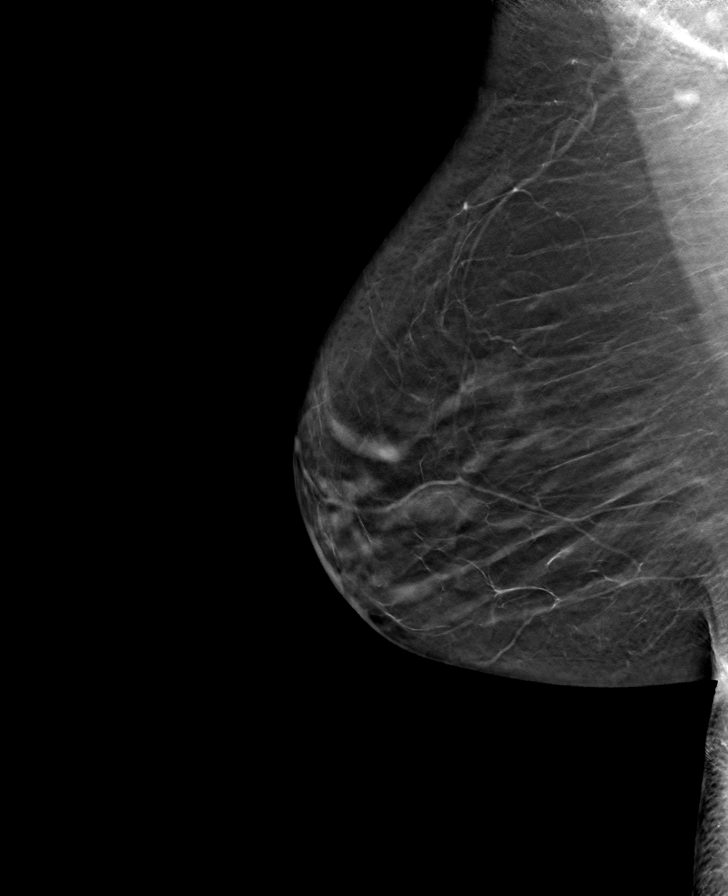

[R CC tomo · tomo slice 28/55.0]
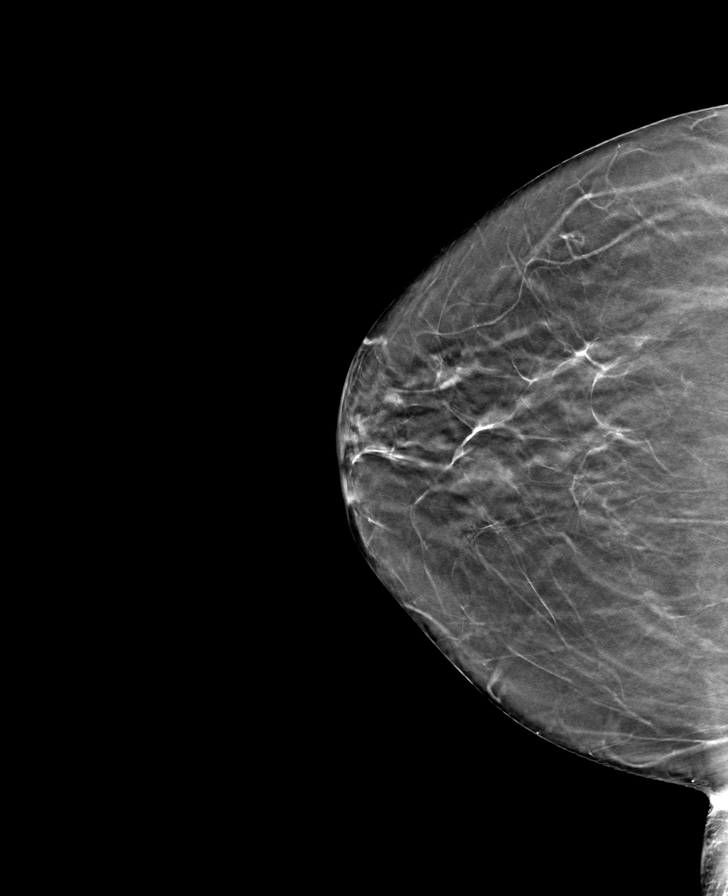

[L MLO tomo · tomo slice 34/67.0]
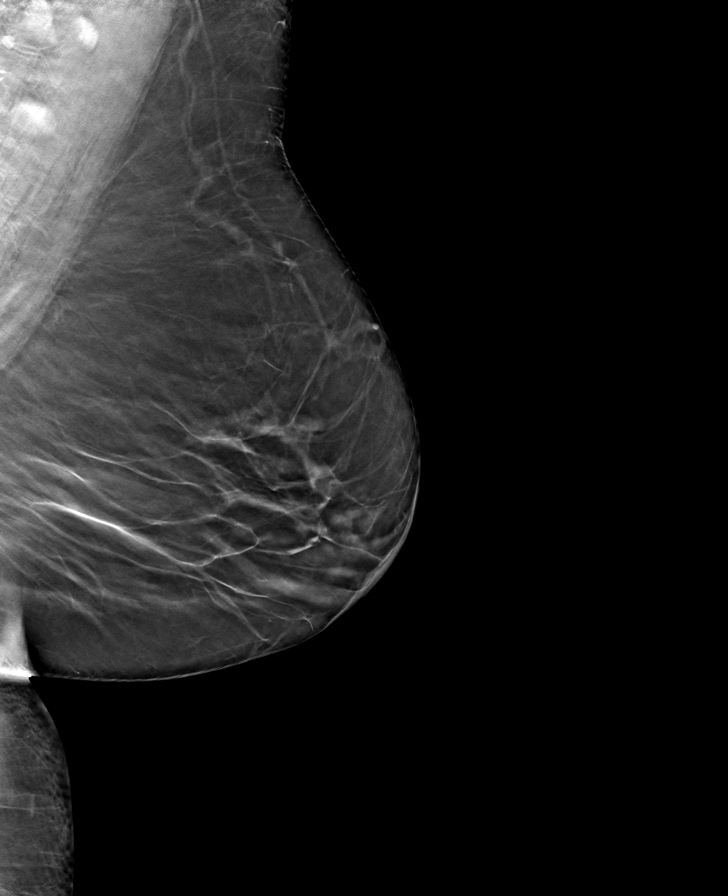

[L CC tomo · tomo slice 29/57.0]
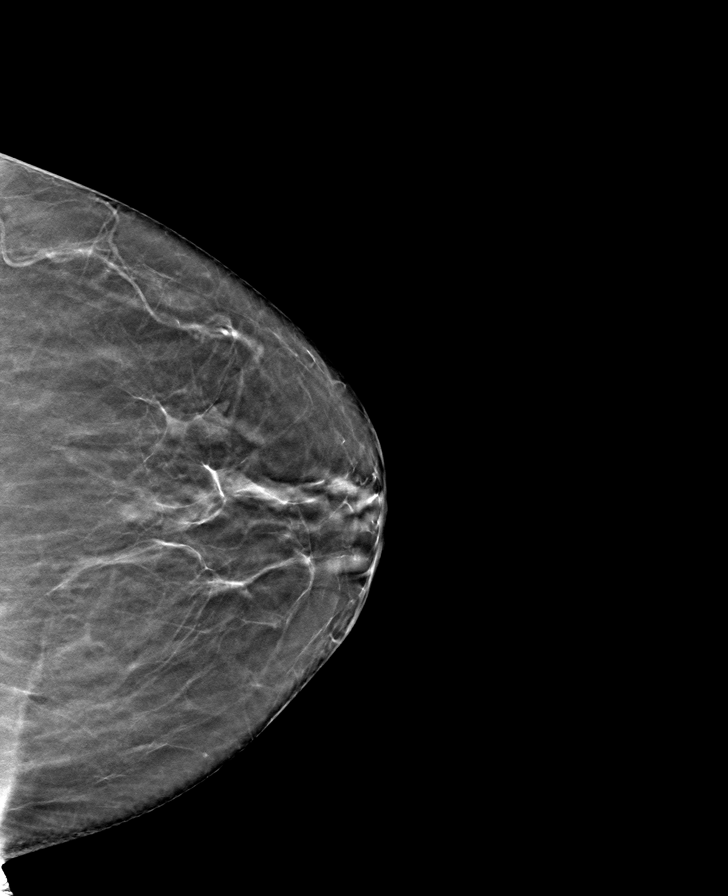

[8 of 24 positions shown; findings below may reference images not displayed]

ACR Breast Density Category b: There are scattered areas of
fibroglandular density.
FINDINGS: There are no findings suspicious for malignancy.
IMPRESSION: No mammographic evidence of malignancy. A result letter of this
screening mammogram will be mailed directly to the patient.

RECOMMENDATION:
Screening mammogram in one year. (Code:51-O-LD2)

BI-RADS CATEGORY  1: Negative.

## 2022-12-21 DIAGNOSIS — Z961 Presence of intraocular lens: Secondary | ICD-10-CM | POA: Diagnosis not present

## 2022-12-21 DIAGNOSIS — H0288B Meibomian gland dysfunction left eye, upper and lower eyelids: Secondary | ICD-10-CM | POA: Diagnosis not present

## 2022-12-21 DIAGNOSIS — H18513 Endothelial corneal dystrophy, bilateral: Secondary | ICD-10-CM | POA: Diagnosis not present

## 2022-12-21 DIAGNOSIS — H0288A Meibomian gland dysfunction right eye, upper and lower eyelids: Secondary | ICD-10-CM | POA: Diagnosis not present

## 2022-12-24 DIAGNOSIS — D649 Anemia, unspecified: Secondary | ICD-10-CM | POA: Diagnosis not present

## 2022-12-24 DIAGNOSIS — Z23 Encounter for immunization: Secondary | ICD-10-CM | POA: Diagnosis not present

## 2022-12-24 DIAGNOSIS — E1122 Type 2 diabetes mellitus with diabetic chronic kidney disease: Secondary | ICD-10-CM | POA: Diagnosis not present

## 2022-12-24 DIAGNOSIS — I10 Essential (primary) hypertension: Secondary | ICD-10-CM | POA: Diagnosis not present

## 2022-12-24 DIAGNOSIS — E7800 Pure hypercholesterolemia, unspecified: Secondary | ICD-10-CM | POA: Diagnosis not present

## 2022-12-25 ENCOUNTER — Other Ambulatory Visit: Payer: Self-pay | Admitting: Family Medicine

## 2022-12-25 DIAGNOSIS — Z1231 Encounter for screening mammogram for malignant neoplasm of breast: Secondary | ICD-10-CM

## 2023-01-20 ENCOUNTER — Ambulatory Visit
Admission: RE | Admit: 2023-01-20 | Discharge: 2023-01-20 | Disposition: A | Discharge status: Home / Self Care | Payer: Medicare Other | Source: Ambulatory Visit | Attending: Family Medicine | Admitting: Family Medicine

## 2023-01-20 DIAGNOSIS — Z1231 Encounter for screening mammogram for malignant neoplasm of breast: Secondary | ICD-10-CM

## 2023-01-30 DIAGNOSIS — Z23 Encounter for immunization: Secondary | ICD-10-CM | POA: Diagnosis not present

## 2023-04-15 ENCOUNTER — Ambulatory Visit: Payer: Medicare HMO | Admitting: Podiatry

## 2023-05-04 ENCOUNTER — Ambulatory Visit (INDEPENDENT_AMBULATORY_CARE_PROVIDER_SITE_OTHER): Payer: Medicare HMO | Admitting: Podiatry

## 2023-05-04 ENCOUNTER — Encounter: Payer: Self-pay | Admitting: Podiatry

## 2023-05-04 DIAGNOSIS — M79676 Pain in unspecified toe(s): Secondary | ICD-10-CM

## 2023-05-04 DIAGNOSIS — B351 Tinea unguium: Secondary | ICD-10-CM | POA: Diagnosis not present

## 2023-05-04 DIAGNOSIS — M722 Plantar fascial fibromatosis: Secondary | ICD-10-CM

## 2023-05-04 MED ORDER — TRIAMCINOLONE ACETONIDE 40 MG/ML IJ SUSP
20.0000 mg | Freq: Once | INTRAMUSCULAR | Status: AC
  Administered 2023-05-04: 20 mg

## 2023-05-04 NOTE — Progress Notes (Signed)
 She presents today with her daughter chief complaint of painful elongated toenails.  Objective: Toenails are long thick yellow dystrophic onychomycotic she also has pain on palpation medial calcaneal tubercle of her left heel.  Pes planovalgus is noted bilateral.  Assessment: Pain in limb secondary to onychomycosis and plantar fasciitis left.  Plan: Injected the left heel today 20 mg Kenalog 5 mg Marcaine point of maximal tenderness.  Debrided nails 1 through 5 bilateral.

## 2023-07-06 DIAGNOSIS — K219 Gastro-esophageal reflux disease without esophagitis: Secondary | ICD-10-CM | POA: Diagnosis not present

## 2023-07-06 DIAGNOSIS — E559 Vitamin D deficiency, unspecified: Secondary | ICD-10-CM | POA: Diagnosis not present

## 2023-07-06 DIAGNOSIS — I10 Essential (primary) hypertension: Secondary | ICD-10-CM | POA: Diagnosis not present

## 2023-07-06 DIAGNOSIS — F3341 Major depressive disorder, recurrent, in partial remission: Secondary | ICD-10-CM | POA: Diagnosis not present

## 2023-07-06 DIAGNOSIS — E1140 Type 2 diabetes mellitus with diabetic neuropathy, unspecified: Secondary | ICD-10-CM | POA: Diagnosis not present

## 2023-07-06 DIAGNOSIS — D649 Anemia, unspecified: Secondary | ICD-10-CM | POA: Diagnosis not present

## 2023-07-06 DIAGNOSIS — E7800 Pure hypercholesterolemia, unspecified: Secondary | ICD-10-CM | POA: Diagnosis not present

## 2023-07-06 DIAGNOSIS — M48062 Spinal stenosis, lumbar region with neurogenic claudication: Secondary | ICD-10-CM | POA: Diagnosis not present

## 2023-07-06 DIAGNOSIS — E1122 Type 2 diabetes mellitus with diabetic chronic kidney disease: Secondary | ICD-10-CM | POA: Diagnosis not present

## 2023-07-06 DIAGNOSIS — I959 Hypotension, unspecified: Secondary | ICD-10-CM | POA: Diagnosis not present

## 2023-07-06 DIAGNOSIS — Z0000 Encounter for general adult medical examination without abnormal findings: Secondary | ICD-10-CM | POA: Diagnosis not present

## 2023-07-06 DIAGNOSIS — E038 Other specified hypothyroidism: Secondary | ICD-10-CM | POA: Diagnosis not present

## 2023-07-15 DIAGNOSIS — E1122 Type 2 diabetes mellitus with diabetic chronic kidney disease: Secondary | ICD-10-CM | POA: Diagnosis not present

## 2023-08-24 ENCOUNTER — Encounter: Payer: Self-pay | Admitting: Podiatry

## 2023-08-24 ENCOUNTER — Ambulatory Visit: Admitting: Podiatry

## 2023-08-24 DIAGNOSIS — M2142 Flat foot [pes planus] (acquired), left foot: Secondary | ICD-10-CM

## 2023-08-24 DIAGNOSIS — B351 Tinea unguium: Secondary | ICD-10-CM | POA: Diagnosis not present

## 2023-08-24 DIAGNOSIS — M2141 Flat foot [pes planus] (acquired), right foot: Secondary | ICD-10-CM | POA: Diagnosis not present

## 2023-08-24 DIAGNOSIS — E119 Type 2 diabetes mellitus without complications: Secondary | ICD-10-CM | POA: Diagnosis not present

## 2023-08-24 DIAGNOSIS — M79676 Pain in unspecified toe(s): Secondary | ICD-10-CM | POA: Diagnosis not present

## 2023-08-24 NOTE — Patient Instructions (Signed)

## 2023-08-26 NOTE — Progress Notes (Signed)
 ANNUAL DIABETIC FOOT EXAM  Subjective: [name] presents today for annual diabetic foot exam. She is accompanied by her daughter and great granddaughter on today's visit. Chief Complaint  Patient presents with   Diabetes    Cut her toenails. Check up.  Dr. Reena Duck - 07/06/2023; A1c - ?   Patient confirms h/o diabetes.  Patient denies any h/o foot wounds.  Duck Reena, MD is patient's PCP.  Past Medical History:  Diagnosis Date   Arthritis    Bilateral leg pain    Degenerative disc disease    Diabetes mellitus without complication (HCC)    Fuch's endothelial dystrophy    Hyperlipidemia    Hypertension    Obesity    Plantar fasciitis    Vertigo    Patient Active Problem List   Diagnosis Date Noted   Low back pain 11/12/2021   Degeneration of lumbar intervertebral disc 07/03/2017   Left lumbar radiculopathy 02/25/2017   Hip pain 02/25/2017   Dysesthesia 02/25/2017   Hand pain 02/25/2017   Blepharitis of both eyes 11/29/2013   Corneal guttata 11/29/2013   No diabetic retinopathy in both eyes 11/29/2013   Ocular migraine 11/29/2013   Bilateral leg pain 12/19/2012   PCO (posterior capsular opacification) 09/23/2011   Fuchs' corneal dystrophy 06/05/2011   Diabetes mellitus (HCC) 04/30/2011   History of gastroesophageal reflux (GERD) 04/30/2011   OBESITY 12/10/2005   INSOMNIA, CHRONIC 12/10/2005   Migraine headache 12/10/2005   CATARACT NEC 12/10/2005   Tinnitus 12/10/2005   Essential hypertension 12/10/2005   Sinusitis, chronic 12/10/2005   Alopecia 12/10/2005   Osteoarthritis 12/10/2005   HEMORRHOIDS, HX OF 12/10/2005   Past Surgical History:  Procedure Laterality Date   CATARACT EXTRACTION Right    CATARACT EXTRACTION Left    ROTATOR CUFF REPAIR Right 07/22/2011   TOTAL KNEE ARTHROPLASTY Right 04/15/2008   TOTAL KNEE ARTHROPLASTY Left    TOTAL VAGINAL HYSTERECTOMY     Current Outpatient Medications on File Prior to Visit  Medication  Sig Dispense Refill   acetaminophen  (TYLENOL ) 325 MG tablet Take by mouth.     aliskiren (TEKTURNA) 300 MG tablet Take 300 mg by mouth daily as needed.     Aliskiren-Hydrochlorothiazide (TEKTURNA HCT) 300-12.5 MG TABS Take 1 tablet by mouth daily.     amLODipine (NORVASC) 10 MG tablet Take 10 mg by mouth daily.     aspirin EC 81 MG tablet Take 81 mg by mouth daily as needed for pain.     b complex vitamins tablet Take 1 tablet by mouth daily.     ciclopirox  (PENLAC ) 8 % solution Apply topically at bedtime. Apply over nail and surrounding skin. Apply daily over previous coat. After seven (7) days, may remove with alcohol and continue cycle. 6.6 mL 0   cyclobenzaprine (FLEXERIL) 5 MG tablet Take 5 mg by mouth 3 (three) times daily as needed for muscle spasms.     diclofenac  Sodium (VOLTAREN ) 1 % GEL Apply 2 g topically 4 (four) times daily. Rub into affected area of foot 2 to 4 times daily 100 g 2   DULoxetine (CYMBALTA) 30 MG capsule TK 1 C PO QD     FLUCONAZOLE PO Take by mouth.     Fluocinonide 0.1 % CREA Apply topically.     fluticasone (VERAMYST) 27.5 MCG/SPRAY nasal spray Place 2 sprays into the nose as needed for rhinitis.     hydrochlorothiazide (MICROZIDE) 12.5 MG capsule Take 12.5 mg by mouth daily.  ketotifen (RA ANTIHISTAMINE EYE DROPS) 0.025 % ophthalmic solution Place 1 drop into both eyes daily as needed (allergies).      Menthol (BIOFREEZE) 10 % AERO Apply topically.     NAPROXEN PO Take by mouth.     nebivolol (BYSTOLIC) 5 MG tablet Take 6.25 mg by mouth daily.      rosuvastatin (CRESTOR) 20 MG tablet Take 20 mg by mouth daily.     traMADol (ULTRAM) 50 MG tablet Take by mouth every 6 (six) hours as needed.     trolamine salicylate (ASPERCREME) 10 % cream Apply 1 application topically 2 (two) times daily as needed for muscle pain.      No current facility-administered medications on file prior to visit.    Allergies  Allergen Reactions   Codeine     Shakes    Diovan  [Valsartan]     Shaking   Metoprolol Tartrate     REACTION: actually allergy to beta blocker   Zocor [Simvastatin]     Alopecia   Social History   Occupational History   Not on file  Tobacco Use   Smoking status: Never   Smokeless tobacco: Never  Vaping Use   Vaping status: Never Used  Substance and Sexual Activity   Alcohol use: No   Drug use: Never   Sexual activity: Not on file   Family History  Problem Relation Age of Onset   Diabetes Mother    Stroke Mother    Heart attack Paternal Aunt        Massive MI   Heart attack Paternal Aunt        Massive MI   Diabetes Son    Hypertension Son    Diabetes Son    Hypertension Son    Hyperlipidemia Son    Hypertension Daughter    Breast cancer Neg Hx    Immunization History  Administered Date(s) Administered   Influenza Whole 12/24/2005   Influenza, High Dose Seasonal PF 12/24/2022   Influenza,inj,quad, With Preservative 11/23/2016, 11/23/2017   PNEUMOCOCCAL CONJUGATE-20 12/31/2021     Review of Systems: Negative except as noted in the HPI.   Objective: There were no vitals filed for this visit.  [name] is a pleasant 83 y.o. female in NAD. AAO X 3.  Diabetic foot exam was performed with the following findings:   Vascular Examination: Capillary refill time immediate b/l. Palpable pedal pulses. Pedal hair sparse b/l. No pain with calf compression b/l. Skin temperature gradient WNL b/l. No cyanosis or clubbing b/l. No ischemia or gangrene noted b/l.   Neurological Examination: Sensation grossly intact b/l with 10 gram monofilament. Vibratory sensation intact b/l.   Dermatological Examination: Pedal skin with normal turgor, texture and tone b/l.  No open wounds. No interdigital macerations.   Toenails 1-5 b/l thick, discolored, elongated with subungual debris and pain on dorsal palpation.   No corns, calluses nor porokeratotic lesions noted.  Musculoskeletal Examination: Muscle strength 5/5 to all  lower extremity muscle groups bilaterally. No pain, crepitus or joint limitation noted with ROM bilateral LE. Pes planovalgus deformity noted b/l lower extremities.  Radiographs: None     ADA Risk Categorization: Low Risk :  Patient has all of the following: Intact protective sensation No prior foot ulcer  No severe deformity Pedal pulses present  Assessment: 1. Pain due to onychomycosis of toenail   2. Acquired pes planovalgus of left foot   3. Acquired pes planovalgus of right foot   4. Type 2 diabetes  mellitus without complication, without long-term current use of insulin (HCC)   5. Encounter for diabetic foot exam (HCC)     Plan: Diabetic foot examination performed today. All patient's and/or POA's questions/concerns addressed on today's visit. Toenails 1-5 debrided in length and girth without incident. Continue foot and shoe inspections daily. Monitor blood glucose per PCP/Endocrinologist's recommendations. Continue soft, supportive shoe gear daily. Report any pedal injuries to medical professional. Call office if there are any questions/concerns. -Patient/POA to call should there be question/concern in the interim. Return in about 3 months (around 11/24/2023).  [name], DPM      [facility] LOCATION: [address], [place] [address]                   Office [phone]   [facility] LOCATION: [address] [place], [place] [address] Office [phone]

## 2023-09-28 ENCOUNTER — Ambulatory Visit: Admitting: Podiatry

## 2023-12-08 ENCOUNTER — Ambulatory Visit: Admitting: Podiatry

## 2024-01-11 DIAGNOSIS — R7303 Prediabetes: Secondary | ICD-10-CM | POA: Diagnosis not present

## 2024-01-11 DIAGNOSIS — D649 Anemia, unspecified: Secondary | ICD-10-CM | POA: Diagnosis not present

## 2024-01-11 DIAGNOSIS — I10 Essential (primary) hypertension: Secondary | ICD-10-CM | POA: Diagnosis not present

## 2024-01-11 DIAGNOSIS — M5136 Other intervertebral disc degeneration, lumbar region with discogenic back pain only: Secondary | ICD-10-CM | POA: Diagnosis not present

## 2024-01-11 DIAGNOSIS — K219 Gastro-esophageal reflux disease without esophagitis: Secondary | ICD-10-CM | POA: Diagnosis not present

## 2024-01-11 DIAGNOSIS — N1832 Chronic kidney disease, stage 3b: Secondary | ICD-10-CM | POA: Diagnosis not present

## 2024-01-11 DIAGNOSIS — E7800 Pure hypercholesterolemia, unspecified: Secondary | ICD-10-CM | POA: Diagnosis not present

## 2024-02-10 DIAGNOSIS — I129 Hypertensive chronic kidney disease with stage 1 through stage 4 chronic kidney disease, or unspecified chronic kidney disease: Secondary | ICD-10-CM | POA: Diagnosis not present

## 2024-02-10 DIAGNOSIS — N1832 Chronic kidney disease, stage 3b: Secondary | ICD-10-CM | POA: Diagnosis not present

## 2024-02-10 DIAGNOSIS — D649 Anemia, unspecified: Secondary | ICD-10-CM | POA: Diagnosis not present

## 2024-02-10 DIAGNOSIS — E1122 Type 2 diabetes mellitus with diabetic chronic kidney disease: Secondary | ICD-10-CM | POA: Diagnosis not present

## 2024-02-10 DIAGNOSIS — N184 Chronic kidney disease, stage 4 (severe): Secondary | ICD-10-CM | POA: Diagnosis not present

## 2024-02-18 ENCOUNTER — Telehealth (HOSPITAL_COMMUNITY): Payer: Self-pay | Admitting: Pharmacy Technician

## 2024-02-18 ENCOUNTER — Encounter (HOSPITAL_COMMUNITY): Payer: Self-pay | Admitting: Nephrology

## 2024-02-18 ENCOUNTER — Other Ambulatory Visit (HOSPITAL_COMMUNITY): Payer: Self-pay | Admitting: Nephrology

## 2024-02-18 DIAGNOSIS — D631 Anemia in chronic kidney disease: Secondary | ICD-10-CM | POA: Insufficient documentation

## 2024-02-18 NOTE — Telephone Encounter (Signed)
 Auth Submission: NO AUTH NEEDED Site of care: [facility] Payer: [employer] MEDICARE Medication & CPT/J Code(s) submitted: Venofer (Iron Sucrose) J1756 Diagnosis Code: N18.9, D63.1 Route of submission (phone, fax, portal):  Phone # Fax # Auth type: Buy/Bill HB Units/visits requested: 300MG  X 1 DOSE Reference number:  Approval from: 02/18/2024 to 05/18/24       [name], CPhT [name] Phone: [phone] 02/18/2024

## 2024-02-24 ENCOUNTER — Encounter (HOSPITAL_COMMUNITY): Payer: Self-pay | Admitting: Nephrology

## 2024-03-02 ENCOUNTER — Ambulatory Visit (HOSPITAL_COMMUNITY)
Admission: RE | Admit: 2024-03-02 | Discharge: 2024-03-02 | Disposition: A | Discharge status: Home / Self Care | Source: Ambulatory Visit | Attending: Nephrology | Admitting: Nephrology

## 2024-03-02 VITALS — BP 114/52 | HR 56 | Temp 97.5°F | Resp 16

## 2024-03-02 DIAGNOSIS — N189 Chronic kidney disease, unspecified: Secondary | ICD-10-CM | POA: Diagnosis present

## 2024-03-02 DIAGNOSIS — D631 Anemia in chronic kidney disease: Secondary | ICD-10-CM | POA: Diagnosis not present

## 2024-03-02 MED ORDER — IRON SUCROSE 300 MG IVPB - SIMPLE MED
300.0000 mg | Freq: Once | Status: AC
  Administered 2024-03-02: 300 mg via INTRAVENOUS

## 2024-03-02 MED ORDER — IRON SUCROSE 300 MG IVPB - SIMPLE MED
Status: AC
  Filled 2024-03-02: qty 265
